# Patient Record
Sex: Female | Born: 1935 | Race: White | Hispanic: No | Smoking: Never smoker
Health system: Southern US, Community
[De-identification: ages and names within clinical notes are randomized; demographics above are authoritative.]

## PROBLEM LIST (undated history)

## (undated) DIAGNOSIS — Z7901 Long term (current) use of anticoagulants: Secondary | ICD-10-CM

## (undated) DIAGNOSIS — I209 Angina pectoris, unspecified: Secondary | ICD-10-CM

## (undated) DIAGNOSIS — I119 Hypertensive heart disease without heart failure: Secondary | ICD-10-CM

## (undated) DIAGNOSIS — I482 Chronic atrial fibrillation, unspecified: Secondary | ICD-10-CM

## (undated) HISTORY — DX: Long term (current) use of anticoagulants: Z79.01

## (undated) HISTORY — PX: LITHOTRIPSY: SUR834

## (undated) HISTORY — DX: Angina pectoris, unspecified: I20.9

## (undated) HISTORY — PX: HAND SURGERY: SHX662

## (undated) HISTORY — DX: Chronic atrial fibrillation, unspecified: I48.20

## (undated) HISTORY — DX: Hypertensive heart disease without heart failure: I11.9

## (undated) HISTORY — PX: CHOLECYSTECTOMY: SHX55

---

## 2009-12-23 ENCOUNTER — Ambulatory Visit (HOSPITAL_COMMUNITY): Admission: RE | Admit: 2009-12-23 | Discharge: 2009-12-23 | Payer: Self-pay | Admitting: Urology

## 2010-06-02 LAB — CBC
Hemoglobin: 13.1 g/dL (ref 12.0–15.0)
MCHC: 34 g/dL (ref 30.0–36.0)
RBC: 4.28 MIL/uL (ref 3.87–5.11)
WBC: 7.5 10*3/uL (ref 4.0–10.5)

## 2010-06-02 LAB — PLATELET FUNCTION ASSAY: Collagen / Epinephrine: 153 seconds (ref 0–184)

## 2010-06-02 LAB — APTT: aPTT: 34 seconds (ref 24–37)

## 2011-03-23 DIAGNOSIS — E7800 Pure hypercholesterolemia, unspecified: Secondary | ICD-10-CM | POA: Diagnosis not present

## 2011-03-23 DIAGNOSIS — I10 Essential (primary) hypertension: Secondary | ICD-10-CM | POA: Diagnosis not present

## 2011-03-23 DIAGNOSIS — E039 Hypothyroidism, unspecified: Secondary | ICD-10-CM | POA: Diagnosis not present

## 2011-03-23 DIAGNOSIS — I4891 Unspecified atrial fibrillation: Secondary | ICD-10-CM | POA: Diagnosis not present

## 2011-05-08 DIAGNOSIS — H2510 Age-related nuclear cataract, unspecified eye: Secondary | ICD-10-CM | POA: Diagnosis not present

## 2011-06-05 DIAGNOSIS — N3090 Cystitis, unspecified without hematuria: Secondary | ICD-10-CM | POA: Diagnosis not present

## 2011-06-05 DIAGNOSIS — E039 Hypothyroidism, unspecified: Secondary | ICD-10-CM | POA: Diagnosis not present

## 2011-06-05 DIAGNOSIS — N200 Calculus of kidney: Secondary | ICD-10-CM | POA: Diagnosis not present

## 2011-06-05 DIAGNOSIS — R3129 Other microscopic hematuria: Secondary | ICD-10-CM | POA: Diagnosis not present

## 2011-06-05 DIAGNOSIS — I10 Essential (primary) hypertension: Secondary | ICD-10-CM | POA: Diagnosis not present

## 2011-06-07 IMAGING — CR DG ABDOMEN 1V
1 series · 1 of 1 positions shown · non-contrast
Comparison: None.

CLINICAL DATA: 82-year-old female preoperative study left renal
calculus.

ABDOMEN - 1 VIEW

[t abdomen supine]
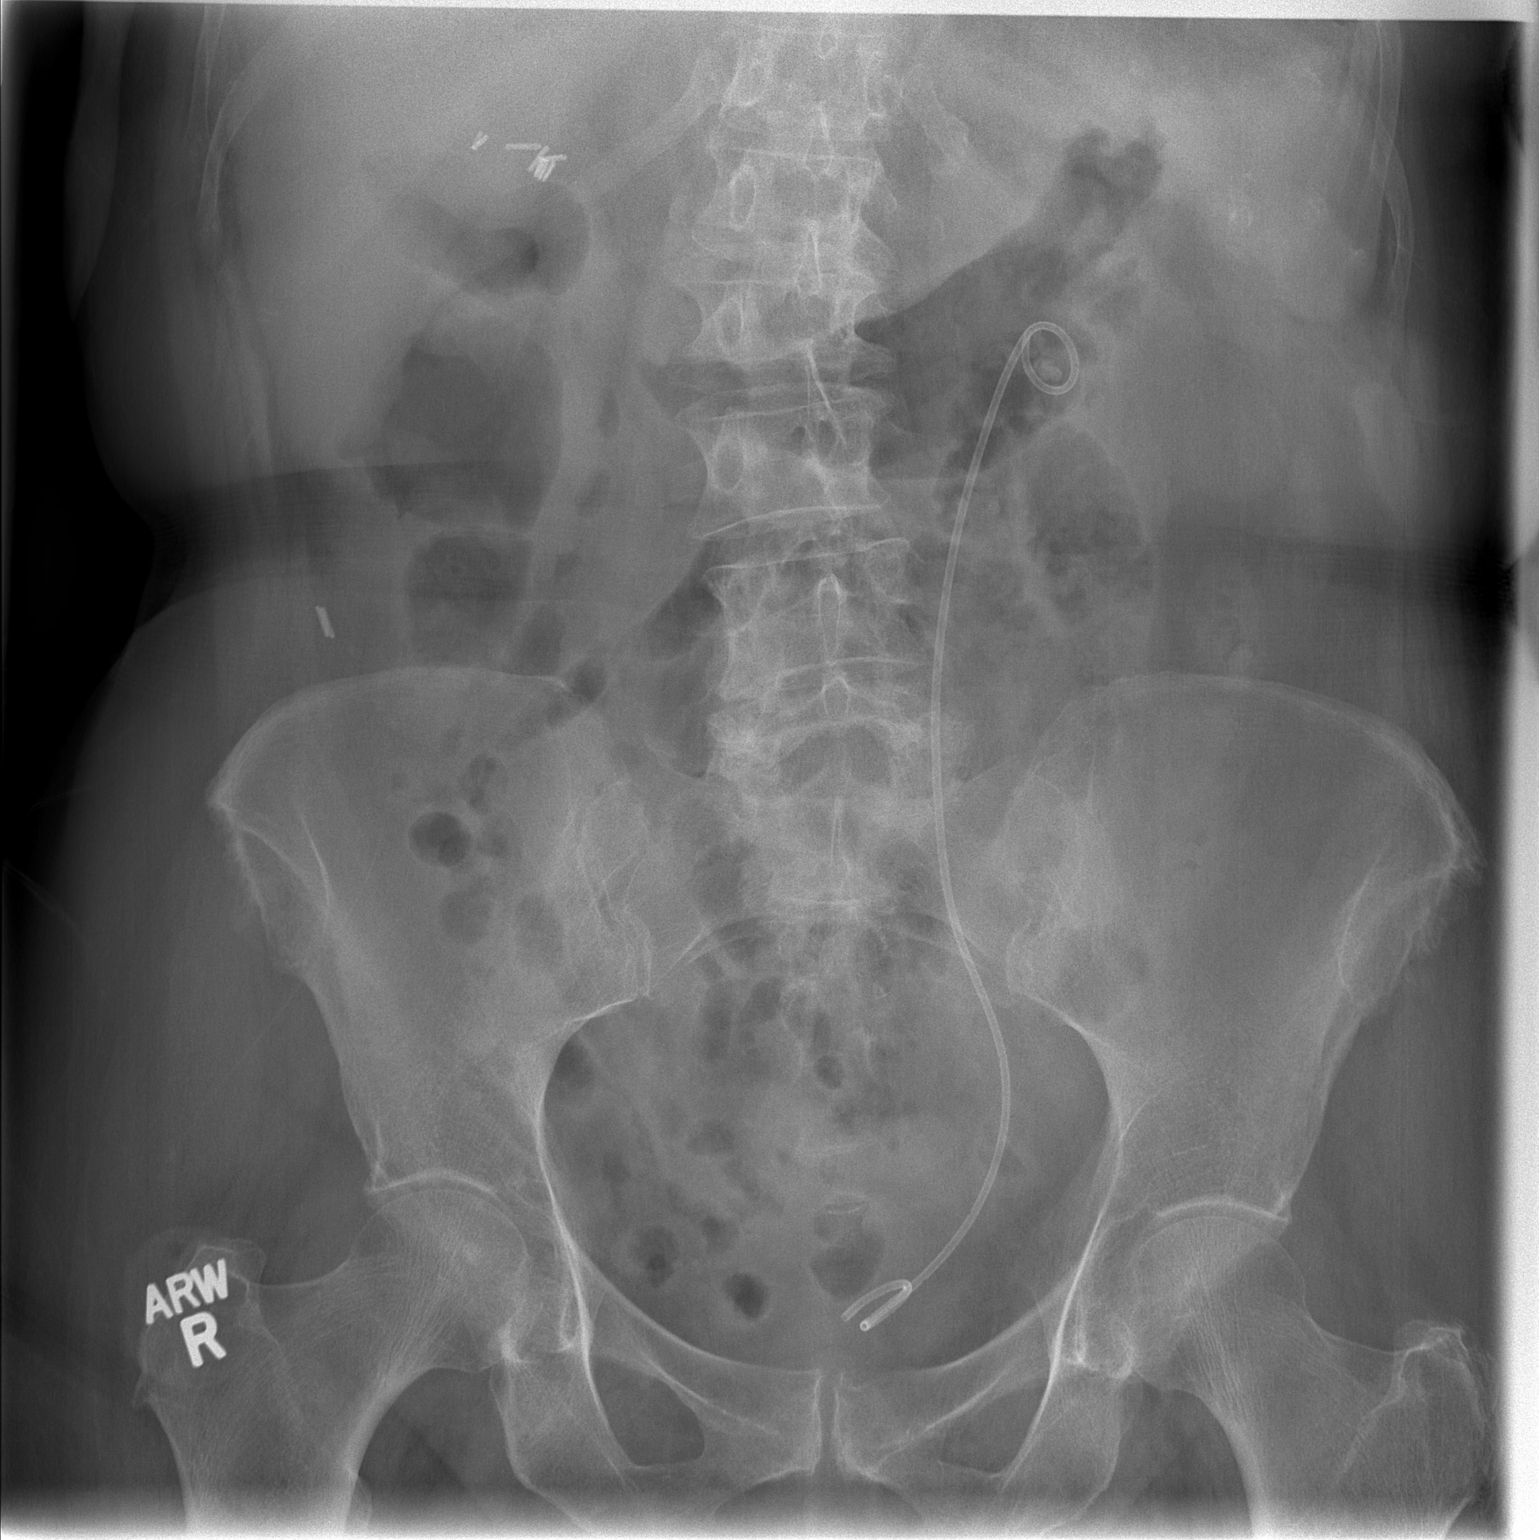

[1 of 1 positions shown; findings below may reference images not displayed]

FINDINGS: Left double-J ureteral stent in place.  The proximal loop
appears to surround a lobulated calcific density measuring 7 x 4
mm.  Alternately, this might reflect enteric contents artifact.
Otherwise no calculus is identified along the course of the stent.
The distal pigtail appears appropriately positioned.  Surgical
clips right upper quadrant and right lower abdomen. Visualized
bowel gas pattern is nonobstructed.  Scoliosis. No acute osseous
abnormality identified.
IMPRESSION: 1.  Query lobulated 7 x 4 mm calculus in the left renal pelvis.
2.  Left double-J ureteral stent in place.  No other urologic
calculus identified.

## 2011-07-18 DIAGNOSIS — H903 Sensorineural hearing loss, bilateral: Secondary | ICD-10-CM | POA: Diagnosis not present

## 2011-08-16 DIAGNOSIS — I10 Essential (primary) hypertension: Secondary | ICD-10-CM | POA: Diagnosis not present

## 2011-08-16 DIAGNOSIS — R079 Chest pain, unspecified: Secondary | ICD-10-CM | POA: Diagnosis not present

## 2011-08-16 DIAGNOSIS — R609 Edema, unspecified: Secondary | ICD-10-CM | POA: Diagnosis not present

## 2011-08-16 DIAGNOSIS — Z7901 Long term (current) use of anticoagulants: Secondary | ICD-10-CM | POA: Diagnosis not present

## 2011-08-16 DIAGNOSIS — I4891 Unspecified atrial fibrillation: Secondary | ICD-10-CM | POA: Diagnosis not present

## 2011-08-22 DIAGNOSIS — I10 Essential (primary) hypertension: Secondary | ICD-10-CM | POA: Diagnosis not present

## 2011-08-22 DIAGNOSIS — I4891 Unspecified atrial fibrillation: Secondary | ICD-10-CM | POA: Diagnosis not present

## 2011-08-22 DIAGNOSIS — E7800 Pure hypercholesterolemia, unspecified: Secondary | ICD-10-CM | POA: Diagnosis not present

## 2011-08-22 DIAGNOSIS — E039 Hypothyroidism, unspecified: Secondary | ICD-10-CM | POA: Diagnosis not present

## 2011-09-06 DIAGNOSIS — I10 Essential (primary) hypertension: Secondary | ICD-10-CM | POA: Diagnosis not present

## 2011-09-06 DIAGNOSIS — R1032 Left lower quadrant pain: Secondary | ICD-10-CM | POA: Diagnosis not present

## 2011-09-06 DIAGNOSIS — I4891 Unspecified atrial fibrillation: Secondary | ICD-10-CM | POA: Diagnosis not present

## 2011-09-12 DIAGNOSIS — R1032 Left lower quadrant pain: Secondary | ICD-10-CM | POA: Diagnosis not present

## 2011-11-06 DIAGNOSIS — H40019 Open angle with borderline findings, low risk, unspecified eye: Secondary | ICD-10-CM | POA: Diagnosis not present

## 2011-11-23 DIAGNOSIS — I10 Essential (primary) hypertension: Secondary | ICD-10-CM | POA: Diagnosis not present

## 2011-11-23 DIAGNOSIS — I4891 Unspecified atrial fibrillation: Secondary | ICD-10-CM | POA: Diagnosis not present

## 2011-11-27 DIAGNOSIS — B351 Tinea unguium: Secondary | ICD-10-CM | POA: Diagnosis not present

## 2011-11-27 DIAGNOSIS — M79609 Pain in unspecified limb: Secondary | ICD-10-CM | POA: Diagnosis not present

## 2012-01-23 DIAGNOSIS — I10 Essential (primary) hypertension: Secondary | ICD-10-CM | POA: Diagnosis not present

## 2012-01-23 DIAGNOSIS — I4891 Unspecified atrial fibrillation: Secondary | ICD-10-CM | POA: Diagnosis not present

## 2012-01-23 DIAGNOSIS — Z23 Encounter for immunization: Secondary | ICD-10-CM | POA: Diagnosis not present

## 2012-04-09 DIAGNOSIS — E039 Hypothyroidism, unspecified: Secondary | ICD-10-CM | POA: Diagnosis not present

## 2012-04-09 DIAGNOSIS — I4891 Unspecified atrial fibrillation: Secondary | ICD-10-CM | POA: Diagnosis not present

## 2012-04-09 DIAGNOSIS — I10 Essential (primary) hypertension: Secondary | ICD-10-CM | POA: Diagnosis not present

## 2012-05-21 DIAGNOSIS — J00 Acute nasopharyngitis [common cold]: Secondary | ICD-10-CM | POA: Diagnosis not present

## 2012-06-10 DIAGNOSIS — H40059 Ocular hypertension, unspecified eye: Secondary | ICD-10-CM | POA: Diagnosis not present

## 2012-06-10 DIAGNOSIS — H2510 Age-related nuclear cataract, unspecified eye: Secondary | ICD-10-CM | POA: Diagnosis not present

## 2012-06-13 DIAGNOSIS — I4891 Unspecified atrial fibrillation: Secondary | ICD-10-CM | POA: Diagnosis not present

## 2012-06-13 DIAGNOSIS — E039 Hypothyroidism, unspecified: Secondary | ICD-10-CM | POA: Diagnosis not present

## 2012-06-13 DIAGNOSIS — I10 Essential (primary) hypertension: Secondary | ICD-10-CM | POA: Diagnosis not present

## 2012-06-24 DIAGNOSIS — M79609 Pain in unspecified limb: Secondary | ICD-10-CM | POA: Diagnosis not present

## 2012-06-24 DIAGNOSIS — B351 Tinea unguium: Secondary | ICD-10-CM | POA: Diagnosis not present

## 2012-07-09 DIAGNOSIS — I10 Essential (primary) hypertension: Secondary | ICD-10-CM | POA: Diagnosis not present

## 2012-07-09 DIAGNOSIS — E039 Hypothyroidism, unspecified: Secondary | ICD-10-CM | POA: Diagnosis not present

## 2012-07-09 DIAGNOSIS — I4891 Unspecified atrial fibrillation: Secondary | ICD-10-CM | POA: Diagnosis not present

## 2012-07-10 DIAGNOSIS — R310 Gross hematuria: Secondary | ICD-10-CM | POA: Diagnosis not present

## 2012-07-15 DIAGNOSIS — R319 Hematuria, unspecified: Secondary | ICD-10-CM | POA: Diagnosis not present

## 2012-07-15 DIAGNOSIS — N200 Calculus of kidney: Secondary | ICD-10-CM | POA: Diagnosis not present

## 2012-07-15 DIAGNOSIS — Q619 Cystic kidney disease, unspecified: Secondary | ICD-10-CM | POA: Diagnosis not present

## 2012-07-17 DIAGNOSIS — I10 Essential (primary) hypertension: Secondary | ICD-10-CM | POA: Diagnosis not present

## 2012-07-17 DIAGNOSIS — Z7901 Long term (current) use of anticoagulants: Secondary | ICD-10-CM | POA: Diagnosis not present

## 2012-07-17 DIAGNOSIS — R319 Hematuria, unspecified: Secondary | ICD-10-CM | POA: Diagnosis not present

## 2012-07-17 DIAGNOSIS — I4891 Unspecified atrial fibrillation: Secondary | ICD-10-CM | POA: Diagnosis not present

## 2012-07-24 DIAGNOSIS — R310 Gross hematuria: Secondary | ICD-10-CM | POA: Diagnosis not present

## 2012-07-24 DIAGNOSIS — Q619 Cystic kidney disease, unspecified: Secondary | ICD-10-CM | POA: Diagnosis not present

## 2012-09-05 DIAGNOSIS — N3090 Cystitis, unspecified without hematuria: Secondary | ICD-10-CM | POA: Diagnosis not present

## 2012-09-05 DIAGNOSIS — N200 Calculus of kidney: Secondary | ICD-10-CM | POA: Diagnosis not present

## 2012-09-17 DIAGNOSIS — N200 Calculus of kidney: Secondary | ICD-10-CM | POA: Diagnosis not present

## 2012-10-01 DIAGNOSIS — N3090 Cystitis, unspecified without hematuria: Secondary | ICD-10-CM | POA: Diagnosis not present

## 2012-10-01 DIAGNOSIS — N200 Calculus of kidney: Secondary | ICD-10-CM | POA: Diagnosis not present

## 2012-10-01 DIAGNOSIS — R310 Gross hematuria: Secondary | ICD-10-CM | POA: Diagnosis not present

## 2012-10-21 DIAGNOSIS — B351 Tinea unguium: Secondary | ICD-10-CM | POA: Diagnosis not present

## 2012-10-21 DIAGNOSIS — M79609 Pain in unspecified limb: Secondary | ICD-10-CM | POA: Diagnosis not present

## 2012-12-03 DIAGNOSIS — R3129 Other microscopic hematuria: Secondary | ICD-10-CM | POA: Diagnosis not present

## 2012-12-03 DIAGNOSIS — Q619 Cystic kidney disease, unspecified: Secondary | ICD-10-CM | POA: Diagnosis not present

## 2012-12-24 DIAGNOSIS — H109 Unspecified conjunctivitis: Secondary | ICD-10-CM | POA: Diagnosis not present

## 2012-12-24 DIAGNOSIS — Z23 Encounter for immunization: Secondary | ICD-10-CM | POA: Diagnosis not present

## 2013-03-17 DIAGNOSIS — R1032 Left lower quadrant pain: Secondary | ICD-10-CM | POA: Diagnosis not present

## 2013-03-17 DIAGNOSIS — I10 Essential (primary) hypertension: Secondary | ICD-10-CM | POA: Diagnosis not present

## 2013-04-07 DIAGNOSIS — R3129 Other microscopic hematuria: Secondary | ICD-10-CM | POA: Diagnosis not present

## 2013-04-07 DIAGNOSIS — N200 Calculus of kidney: Secondary | ICD-10-CM | POA: Diagnosis not present

## 2013-04-07 DIAGNOSIS — Q619 Cystic kidney disease, unspecified: Secondary | ICD-10-CM | POA: Diagnosis not present

## 2013-05-29 DIAGNOSIS — Z7901 Long term (current) use of anticoagulants: Secondary | ICD-10-CM | POA: Diagnosis not present

## 2013-05-29 DIAGNOSIS — Z79899 Other long term (current) drug therapy: Secondary | ICD-10-CM | POA: Diagnosis not present

## 2013-05-29 DIAGNOSIS — Z0000 Encounter for general adult medical examination without abnormal findings: Secondary | ICD-10-CM | POA: Diagnosis not present

## 2013-05-29 DIAGNOSIS — R609 Edema, unspecified: Secondary | ICD-10-CM | POA: Diagnosis not present

## 2013-05-29 DIAGNOSIS — I4891 Unspecified atrial fibrillation: Secondary | ICD-10-CM | POA: Diagnosis not present

## 2013-05-29 DIAGNOSIS — R079 Chest pain, unspecified: Secondary | ICD-10-CM | POA: Diagnosis not present

## 2013-05-29 DIAGNOSIS — J309 Allergic rhinitis, unspecified: Secondary | ICD-10-CM | POA: Diagnosis not present

## 2013-05-29 DIAGNOSIS — I10 Essential (primary) hypertension: Secondary | ICD-10-CM | POA: Diagnosis not present

## 2013-07-17 DIAGNOSIS — H40059 Ocular hypertension, unspecified eye: Secondary | ICD-10-CM | POA: Diagnosis not present

## 2013-07-17 DIAGNOSIS — H2510 Age-related nuclear cataract, unspecified eye: Secondary | ICD-10-CM | POA: Diagnosis not present

## 2013-07-22 ENCOUNTER — Encounter: Payer: Self-pay | Admitting: Podiatrist

## 2013-07-22 ENCOUNTER — Ambulatory Visit (INDEPENDENT_AMBULATORY_CARE_PROVIDER_SITE_OTHER): Payer: Medicare Other | Admitting: Podiatrist

## 2013-07-22 VITALS — BP 105/56 | HR 108 | Resp 18

## 2013-07-22 DIAGNOSIS — B351 Tinea unguium: Secondary | ICD-10-CM | POA: Diagnosis not present

## 2013-07-22 DIAGNOSIS — M79609 Pain in unspecified limb: Secondary | ICD-10-CM | POA: Diagnosis not present

## 2013-07-22 NOTE — Progress Notes (Signed)
   Subjective:    Patient ID: Amber Cline, female    DOB: 10/28/1926, 80 y.o.   MRN: [id number]  HPI I am here to get my toenails trimmed up    Review of Systems     Objective:   Physical Exam  Patient is awake, alert, and oriented x 3.  In no acute distress.  Neurovascular status is intact with palpable pedal pulses at 2/4 DP and PT bilateral and capillary refill time within normal limits. Neurological sensation is also intact bilaterally both epicritically and protectively. Dermatological exam reveals skin color, turger and texture as normal. No open lesions present. Toenails are thick discolored, dystrophic and mycotic esp 1,2 bilateral.   Musculoskeletal examination reveals good muscle, strength, tone and stability. Bunions with crowding of digits present.  Relates she broker her right great toe as a child. Musculature intact with dorsiflexion, plantarflexion, inversion, eversion. subjcetively relates numbness and tingiling    Assessment & Plan:  Painful toenails 1 through 5 bilateral  Plan: Debridement carried out today without complication. She'll be seen back in 3 months or as needed for followup. Of note she was very tender to digits one and 2 bilateral.

## 2013-09-08 DIAGNOSIS — I10 Essential (primary) hypertension: Secondary | ICD-10-CM | POA: Diagnosis not present

## 2013-09-08 DIAGNOSIS — E039 Hypothyroidism, unspecified: Secondary | ICD-10-CM | POA: Diagnosis not present

## 2013-09-08 DIAGNOSIS — J310 Chronic rhinitis: Secondary | ICD-10-CM | POA: Diagnosis not present

## 2013-10-21 DIAGNOSIS — H25049 Posterior subcapsular polar age-related cataract, unspecified eye: Secondary | ICD-10-CM | POA: Diagnosis not present

## 2013-10-21 DIAGNOSIS — H2510 Age-related nuclear cataract, unspecified eye: Secondary | ICD-10-CM | POA: Diagnosis not present

## 2013-10-21 DIAGNOSIS — H18519 Endothelial corneal dystrophy, unspecified eye: Secondary | ICD-10-CM | POA: Diagnosis not present

## 2013-10-21 DIAGNOSIS — H40019 Open angle with borderline findings, low risk, unspecified eye: Secondary | ICD-10-CM | POA: Diagnosis not present

## 2013-10-23 DIAGNOSIS — L723 Sebaceous cyst: Secondary | ICD-10-CM | POA: Diagnosis not present

## 2013-10-23 DIAGNOSIS — R609 Edema, unspecified: Secondary | ICD-10-CM | POA: Diagnosis not present

## 2013-10-30 DIAGNOSIS — H2589 Other age-related cataract: Secondary | ICD-10-CM | POA: Diagnosis not present

## 2013-10-30 DIAGNOSIS — H2510 Age-related nuclear cataract, unspecified eye: Secondary | ICD-10-CM | POA: Diagnosis not present

## 2013-10-30 DIAGNOSIS — H25049 Posterior subcapsular polar age-related cataract, unspecified eye: Secondary | ICD-10-CM | POA: Diagnosis not present

## 2013-11-13 DIAGNOSIS — H2589 Other age-related cataract: Secondary | ICD-10-CM | POA: Diagnosis not present

## 2013-11-13 DIAGNOSIS — H25049 Posterior subcapsular polar age-related cataract, unspecified eye: Secondary | ICD-10-CM | POA: Diagnosis not present

## 2013-11-13 DIAGNOSIS — H2510 Age-related nuclear cataract, unspecified eye: Secondary | ICD-10-CM | POA: Diagnosis not present

## 2013-11-19 DIAGNOSIS — N200 Calculus of kidney: Secondary | ICD-10-CM | POA: Diagnosis not present

## 2013-11-19 DIAGNOSIS — R310 Gross hematuria: Secondary | ICD-10-CM | POA: Diagnosis not present

## 2013-11-19 DIAGNOSIS — R109 Unspecified abdominal pain: Secondary | ICD-10-CM | POA: Diagnosis not present

## 2013-11-25 DIAGNOSIS — N200 Calculus of kidney: Secondary | ICD-10-CM | POA: Diagnosis not present

## 2013-11-25 DIAGNOSIS — Q619 Cystic kidney disease, unspecified: Secondary | ICD-10-CM | POA: Diagnosis not present

## 2013-11-25 DIAGNOSIS — N210 Calculus in bladder: Secondary | ICD-10-CM | POA: Diagnosis not present

## 2013-11-26 DIAGNOSIS — R310 Gross hematuria: Secondary | ICD-10-CM | POA: Diagnosis not present

## 2013-11-26 DIAGNOSIS — N200 Calculus of kidney: Secondary | ICD-10-CM | POA: Diagnosis not present

## 2013-11-27 DIAGNOSIS — N200 Calculus of kidney: Secondary | ICD-10-CM | POA: Diagnosis not present

## 2013-12-23 DIAGNOSIS — Z23 Encounter for immunization: Secondary | ICD-10-CM | POA: Diagnosis not present

## 2013-12-23 DIAGNOSIS — I10 Essential (primary) hypertension: Secondary | ICD-10-CM | POA: Diagnosis not present

## 2013-12-23 DIAGNOSIS — E039 Hypothyroidism, unspecified: Secondary | ICD-10-CM | POA: Diagnosis not present

## 2013-12-29 DIAGNOSIS — N200 Calculus of kidney: Secondary | ICD-10-CM | POA: Diagnosis not present

## 2013-12-29 DIAGNOSIS — R109 Unspecified abdominal pain: Secondary | ICD-10-CM | POA: Diagnosis not present

## 2013-12-30 DIAGNOSIS — I209 Angina pectoris, unspecified: Secondary | ICD-10-CM | POA: Diagnosis not present

## 2013-12-30 DIAGNOSIS — I482 Chronic atrial fibrillation: Secondary | ICD-10-CM | POA: Diagnosis not present

## 2013-12-30 DIAGNOSIS — Z7901 Long term (current) use of anticoagulants: Secondary | ICD-10-CM | POA: Diagnosis not present

## 2013-12-30 DIAGNOSIS — I119 Hypertensive heart disease without heart failure: Secondary | ICD-10-CM | POA: Diagnosis not present

## 2014-03-25 DIAGNOSIS — E559 Vitamin D deficiency, unspecified: Secondary | ICD-10-CM | POA: Diagnosis not present

## 2014-03-25 DIAGNOSIS — I10 Essential (primary) hypertension: Secondary | ICD-10-CM | POA: Diagnosis not present

## 2014-03-25 DIAGNOSIS — E039 Hypothyroidism, unspecified: Secondary | ICD-10-CM | POA: Diagnosis not present

## 2014-03-25 DIAGNOSIS — I4891 Unspecified atrial fibrillation: Secondary | ICD-10-CM | POA: Diagnosis not present

## 2014-04-07 DIAGNOSIS — Z7901 Long term (current) use of anticoagulants: Secondary | ICD-10-CM | POA: Diagnosis not present

## 2014-04-07 DIAGNOSIS — I209 Angina pectoris, unspecified: Secondary | ICD-10-CM | POA: Diagnosis not present

## 2014-04-07 DIAGNOSIS — I119 Hypertensive heart disease without heart failure: Secondary | ICD-10-CM | POA: Diagnosis not present

## 2014-04-07 DIAGNOSIS — I482 Chronic atrial fibrillation: Secondary | ICD-10-CM | POA: Diagnosis not present

## 2014-08-11 DIAGNOSIS — I10 Essential (primary) hypertension: Secondary | ICD-10-CM | POA: Diagnosis not present

## 2014-08-11 DIAGNOSIS — I4891 Unspecified atrial fibrillation: Secondary | ICD-10-CM | POA: Diagnosis not present

## 2014-08-11 DIAGNOSIS — M79602 Pain in left arm: Secondary | ICD-10-CM | POA: Diagnosis not present

## 2014-08-21 DIAGNOSIS — H02054 Trichiasis without entropian left upper eyelid: Secondary | ICD-10-CM | POA: Diagnosis not present

## 2014-08-21 DIAGNOSIS — H04123 Dry eye syndrome of bilateral lacrimal glands: Secondary | ICD-10-CM | POA: Diagnosis not present

## 2014-12-15 DIAGNOSIS — H40053 Ocular hypertension, bilateral: Secondary | ICD-10-CM | POA: Diagnosis not present

## 2014-12-15 DIAGNOSIS — Z961 Presence of intraocular lens: Secondary | ICD-10-CM | POA: Diagnosis not present

## 2014-12-18 DIAGNOSIS — Z23 Encounter for immunization: Secondary | ICD-10-CM | POA: Diagnosis not present

## 2015-03-08 ENCOUNTER — Ambulatory Visit: Payer: Medicare Other | Admitting: Podiatry

## 2015-04-26 DIAGNOSIS — I10 Essential (primary) hypertension: Secondary | ICD-10-CM | POA: Diagnosis not present

## 2015-04-26 DIAGNOSIS — I4891 Unspecified atrial fibrillation: Secondary | ICD-10-CM | POA: Diagnosis not present

## 2015-04-26 DIAGNOSIS — E039 Hypothyroidism, unspecified: Secondary | ICD-10-CM | POA: Diagnosis not present

## 2015-06-06 DIAGNOSIS — Z7901 Long term (current) use of anticoagulants: Secondary | ICD-10-CM | POA: Insufficient documentation

## 2015-06-06 DIAGNOSIS — I119 Hypertensive heart disease without heart failure: Secondary | ICD-10-CM

## 2015-06-06 DIAGNOSIS — I209 Angina pectoris, unspecified: Secondary | ICD-10-CM

## 2015-06-06 DIAGNOSIS — I482 Chronic atrial fibrillation, unspecified: Secondary | ICD-10-CM | POA: Insufficient documentation

## 2015-06-06 HISTORY — DX: Long term (current) use of anticoagulants: Z79.01

## 2015-06-06 HISTORY — DX: Hypertensive heart disease without heart failure: I11.9

## 2015-06-06 HISTORY — DX: Angina pectoris, unspecified: I20.9

## 2015-06-06 HISTORY — DX: Chronic atrial fibrillation, unspecified: I48.20

## 2015-06-07 DIAGNOSIS — I119 Hypertensive heart disease without heart failure: Secondary | ICD-10-CM | POA: Diagnosis not present

## 2015-06-07 DIAGNOSIS — Z7901 Long term (current) use of anticoagulants: Secondary | ICD-10-CM | POA: Diagnosis not present

## 2015-06-07 DIAGNOSIS — I482 Chronic atrial fibrillation: Secondary | ICD-10-CM | POA: Diagnosis not present

## 2015-07-02 DIAGNOSIS — Z7901 Long term (current) use of anticoagulants: Secondary | ICD-10-CM | POA: Diagnosis not present

## 2015-07-02 DIAGNOSIS — I10 Essential (primary) hypertension: Secondary | ICD-10-CM | POA: Diagnosis not present

## 2015-07-09 DIAGNOSIS — Z880 Allergy status to penicillin: Secondary | ICD-10-CM | POA: Diagnosis not present

## 2015-07-09 DIAGNOSIS — R079 Chest pain, unspecified: Secondary | ICD-10-CM | POA: Diagnosis not present

## 2015-07-09 DIAGNOSIS — Z7902 Long term (current) use of antithrombotics/antiplatelets: Secondary | ICD-10-CM | POA: Diagnosis not present

## 2015-07-09 DIAGNOSIS — Z8673 Personal history of transient ischemic attack (TIA), and cerebral infarction without residual deficits: Secondary | ICD-10-CM | POA: Diagnosis not present

## 2015-07-09 DIAGNOSIS — Z79899 Other long term (current) drug therapy: Secondary | ICD-10-CM | POA: Diagnosis not present

## 2015-07-09 DIAGNOSIS — I481 Persistent atrial fibrillation: Secondary | ICD-10-CM | POA: Diagnosis not present

## 2015-07-09 DIAGNOSIS — I200 Unstable angina: Secondary | ICD-10-CM | POA: Diagnosis not present

## 2015-07-09 DIAGNOSIS — I160 Hypertensive urgency: Secondary | ICD-10-CM | POA: Diagnosis not present

## 2015-07-09 DIAGNOSIS — E785 Hyperlipidemia, unspecified: Secondary | ICD-10-CM | POA: Diagnosis present

## 2015-07-09 DIAGNOSIS — I4891 Unspecified atrial fibrillation: Secondary | ICD-10-CM | POA: Diagnosis present

## 2015-07-14 ENCOUNTER — Ambulatory Visit (INDEPENDENT_AMBULATORY_CARE_PROVIDER_SITE_OTHER): Payer: Medicare Other | Admitting: Sports Medicine

## 2015-07-14 ENCOUNTER — Encounter: Payer: Self-pay | Admitting: Sports Medicine

## 2015-07-14 DIAGNOSIS — B351 Tinea unguium: Secondary | ICD-10-CM | POA: Diagnosis not present

## 2015-07-14 DIAGNOSIS — M79673 Pain in unspecified foot: Secondary | ICD-10-CM | POA: Diagnosis not present

## 2015-07-14 DIAGNOSIS — I739 Peripheral vascular disease, unspecified: Secondary | ICD-10-CM

## 2015-07-14 DIAGNOSIS — M21619 Bunion of unspecified foot: Secondary | ICD-10-CM | POA: Diagnosis not present

## 2015-07-14 DIAGNOSIS — Z7901 Long term (current) use of anticoagulants: Secondary | ICD-10-CM

## 2015-07-14 DIAGNOSIS — M2040 Other hammer toe(s) (acquired), unspecified foot: Secondary | ICD-10-CM

## 2015-07-14 NOTE — Progress Notes (Signed)
Patient ID: Amber ChamberJeane E Cline, female   DOB: 02-08-1927, 82 y.o.   MRN: [id number] Subjective: Amber Cline is a 82 y.o. female patient seen today in office with complaint of painful thickened and elongated toenails; unable to trim. Patient denies history of known Diabetes, Neuropathy, or Vascular disease however admits to occasional numbness to the toes and also swelling in the lower extremities, which she uses compression stockings for. Patient has no other pedal complaints at this time.   Patient Active Problem List   Diagnosis Date Noted  . Angina pectoris (HCC) 06/06/2015  . Long term current use of anticoagulant 06/06/2015  . Chronic atrial fibrillation (HCC) 06/06/2015  . HCD (hypertensive cardiovascular disease) 06/06/2015    No current outpatient prescriptions on file prior to visit.   No current facility-administered medications on file prior to visit.    Allergies  Allergen Reactions  . Peanut Oil   . Penicillins     Objective: Physical Exam  General: Well developed, nourished, no acute distress, awake, alert and oriented x 3  Vascular: Dorsalis pedis artery 1/4 bilateral, Posterior tibial artery 1/4 faint bilateral, skin temperature warm to warm proximal to distal bilateral lower extremities, + varicosities,  Trace edema bilateral ankles, Decreased pedal hair present bilateral.  Neurological: Gross sensation present via light touch bilateral.   Dermatological: Skin is warm, dry, and supple bilateral, Nails 1-10 are tender, long, thick, and discolored with mild subungal debris, no webspace macerations present bilateral, no open lesions present bilateral, no callus/corns/hyperkeratotic tissue present bilateral. No signs of infection bilateral.  Musculoskeletal:  Hammertoe and bunion deformities noted bilateral. Muscular strength within normal limits without pain on range of motion. No pain with calf compression bilateral.  Assessment and Plan:  Problem List Items  Addressed This Visit    None    Visit Diagnoses    Dermatophytosis of nail    -  Primary    Relevant Medications    clindamycin (CLEOCIN) 150 MG capsule    metroNIDAZOLE (FLAGYL) 250 MG tablet    Foot pain, unspecified laterality        Bunion        Hammertoe, unspecified laterality        PVD (peripheral vascular disease) (HCC)        Relevant Medications    ELIQUIS 5 MG TABS tablet    candesartan (ATACAND) 32 MG tablet    diltiazem (TIAZAC) 180 MG 24 hr capsule    metoprolol tartrate (LOPRESSOR) 25 MG tablet    Anticoagulant long-term use           -Examined patient.  -Discussed treatment options for painful mycotic nails. -Mechanically debrided and reduced mycotic nails with sterile nail nipper and dremel nail file without incident. -Recommended good supportive shoes daily for foot type -Recommend continue with compression stockings and lower leg elevation to assist with edema control -Patient to return in 4 months/as needed for follow up evaluation or sooner if symptoms worsen.  Asencion Islamitorya Morrison Masser, DPM

## 2015-07-15 DIAGNOSIS — I4891 Unspecified atrial fibrillation: Secondary | ICD-10-CM | POA: Diagnosis not present

## 2015-07-15 DIAGNOSIS — R0602 Shortness of breath: Secondary | ICD-10-CM | POA: Diagnosis not present

## 2015-07-23 DIAGNOSIS — I4891 Unspecified atrial fibrillation: Secondary | ICD-10-CM | POA: Diagnosis not present

## 2015-07-28 DIAGNOSIS — E039 Hypothyroidism, unspecified: Secondary | ICD-10-CM | POA: Diagnosis not present

## 2015-07-28 DIAGNOSIS — I4891 Unspecified atrial fibrillation: Secondary | ICD-10-CM | POA: Diagnosis not present

## 2015-07-28 DIAGNOSIS — I10 Essential (primary) hypertension: Secondary | ICD-10-CM | POA: Diagnosis not present

## 2015-09-06 DIAGNOSIS — H5712 Ocular pain, left eye: Secondary | ICD-10-CM | POA: Diagnosis not present

## 2015-09-29 DIAGNOSIS — I10 Essential (primary) hypertension: Secondary | ICD-10-CM | POA: Diagnosis not present

## 2015-09-29 DIAGNOSIS — E039 Hypothyroidism, unspecified: Secondary | ICD-10-CM | POA: Diagnosis not present

## 2015-09-29 DIAGNOSIS — I4891 Unspecified atrial fibrillation: Secondary | ICD-10-CM | POA: Diagnosis not present

## 2015-11-10 ENCOUNTER — Ambulatory Visit: Payer: Medicare Other | Admitting: Sports Medicine

## 2015-11-25 ENCOUNTER — Ambulatory Visit: Payer: Medicare Other | Admitting: Sports Medicine

## 2015-12-08 ENCOUNTER — Ambulatory Visit: Payer: Medicare Other | Admitting: Sports Medicine

## 2015-12-23 ENCOUNTER — Ambulatory Visit: Payer: Medicare Other | Admitting: Sports Medicine

## 2016-01-04 DIAGNOSIS — I119 Hypertensive heart disease without heart failure: Secondary | ICD-10-CM | POA: Diagnosis not present

## 2016-01-04 DIAGNOSIS — I482 Chronic atrial fibrillation: Secondary | ICD-10-CM | POA: Diagnosis not present

## 2016-01-04 DIAGNOSIS — Z7901 Long term (current) use of anticoagulants: Secondary | ICD-10-CM | POA: Diagnosis not present

## 2016-01-05 DIAGNOSIS — Z23 Encounter for immunization: Secondary | ICD-10-CM | POA: Diagnosis not present

## 2016-01-05 DIAGNOSIS — I4891 Unspecified atrial fibrillation: Secondary | ICD-10-CM | POA: Diagnosis not present

## 2016-01-05 DIAGNOSIS — E785 Hyperlipidemia, unspecified: Secondary | ICD-10-CM | POA: Diagnosis not present

## 2016-01-05 DIAGNOSIS — E039 Hypothyroidism, unspecified: Secondary | ICD-10-CM | POA: Diagnosis not present

## 2016-01-05 DIAGNOSIS — I10 Essential (primary) hypertension: Secondary | ICD-10-CM | POA: Diagnosis not present

## 2016-04-11 DIAGNOSIS — H40053 Ocular hypertension, bilateral: Secondary | ICD-10-CM | POA: Diagnosis not present

## 2016-04-11 DIAGNOSIS — Z0100 Encounter for examination of eyes and vision without abnormal findings: Secondary | ICD-10-CM | POA: Diagnosis not present

## 2016-04-26 DIAGNOSIS — J101 Influenza due to other identified influenza virus with other respiratory manifestations: Secondary | ICD-10-CM | POA: Diagnosis not present

## 2016-07-06 DIAGNOSIS — R609 Edema, unspecified: Secondary | ICD-10-CM | POA: Diagnosis not present

## 2016-08-02 DIAGNOSIS — I4891 Unspecified atrial fibrillation: Secondary | ICD-10-CM | POA: Diagnosis not present

## 2016-08-02 DIAGNOSIS — I10 Essential (primary) hypertension: Secondary | ICD-10-CM | POA: Diagnosis not present

## 2016-08-02 DIAGNOSIS — E039 Hypothyroidism, unspecified: Secondary | ICD-10-CM | POA: Diagnosis not present

## 2016-08-02 DIAGNOSIS — Z7901 Long term (current) use of anticoagulants: Secondary | ICD-10-CM | POA: Diagnosis not present

## 2016-08-02 DIAGNOSIS — M25561 Pain in right knee: Secondary | ICD-10-CM | POA: Diagnosis not present

## 2016-08-30 DIAGNOSIS — M25561 Pain in right knee: Secondary | ICD-10-CM | POA: Diagnosis not present

## 2016-09-15 ENCOUNTER — Ambulatory Visit (INDEPENDENT_AMBULATORY_CARE_PROVIDER_SITE_OTHER): Payer: Medicare Other | Admitting: Sports Medicine

## 2016-09-15 DIAGNOSIS — B351 Tinea unguium: Secondary | ICD-10-CM

## 2016-09-15 DIAGNOSIS — M79676 Pain in unspecified toe(s): Secondary | ICD-10-CM | POA: Diagnosis not present

## 2016-09-15 DIAGNOSIS — Z7901 Long term (current) use of anticoagulants: Secondary | ICD-10-CM

## 2016-09-15 DIAGNOSIS — I739 Peripheral vascular disease, unspecified: Secondary | ICD-10-CM

## 2016-09-15 NOTE — Progress Notes (Signed)
Patient ID: Amber Cline, female   DOB: [birth year], 83 y.o.   MRN: [id number] Subjective: Amber Cline is a 83 y.o. female patient seen today in office with complaint of painful thickened and elongated toenails; unable to trim. Patient denies any changes with medical history since last visit. States that she has a hard time getting here on a regular schedule because of her sons work. Patient has no other pedal complaints at this time.   Patient Active Problem List   Diagnosis Date Noted  . Angina pectoris (HCC) 06/06/2015  . Long term current use of anticoagulant 06/06/2015  . Chronic atrial fibrillation (HCC) 06/06/2015  . HCD (hypertensive cardiovascular disease) 06/06/2015    Current Outpatient Prescriptions on File Prior to Visit  Medication Sig Dispense Refill  . BOOSTRIX 5-2.5-18.5 injection TO BE ADMINISTERED BY PHARMACIST FOR IMMUNIZATION  0  . candesartan (ATACAND) 32 MG tablet Take 32 mg by mouth.    . clindamycin (CLEOCIN) 150 MG capsule TAKE ALL 4 CAPSULES 1 HOUR BEFORE ANY DENTAL APPOINTMENT  12  . diltiazem (TIAZAC) 180 MG 24 hr capsule     . ELIQUIS 5 MG TABS tablet TAKE 1 TABLET (5 MG TOTAL) BY MOUTH TWO (2) TIMES A DAY. AFTER MISSING 4 DOSES OF PRADAXA  11  . levothyroxine (SYNTHROID) 137 MCG tablet Take by mouth.    . metoprolol tartrate (LOPRESSOR) 25 MG tablet TAKE 1/2 TABLET (12.5 MG) BY MOUTH TWICE DAILY  0  . metroNIDAZOLE (FLAGYL) 250 MG tablet Take 250 mg by mouth 2 (two) times daily.  0  . SYNTHROID 125 MCG tablet Take 125 mcg by mouth daily.  3  . SYNTHROID 137 MCG tablet Take 137 mcg by mouth daily.  2  . Vitamin D, Ergocalciferol, (DRISDOL) 50000 units CAPS capsule Take by mouth.     No current facility-administered medications on file prior to visit.     Allergies  Allergen Reactions  . Peanut Oil   . Penicillins     Objective: Physical Exam  General: Well developed, nourished, no acute distress, awake, alert and oriented x 3  Vascular:  Dorsalis pedis artery 1/4 bilateral, Posterior tibial artery 1/4 faint bilateral, skin temperature warm to warm proximal to distal bilateral lower extremities, + varicosities,  Trace edema bilateral ankles, Decreased pedal hair present bilateral.  Neurological: Gross sensation present via light touch bilateral.   Dermatological: Skin is warm, dry, and supple bilateral, Nails 1-10 are tender, long, thick, and discolored with moderate subungal debris, no webspace macerations present bilateral, no open lesions present bilateral, no callus/corns/hyperkeratotic tissue present bilateral. No signs of infection bilateral.  Musculoskeletal:  Hammertoe and bunion deformities noted bilateral. Muscular strength within normal limits without pain on range of motion. No pain with calf compression bilateral.  Assessment and Plan:  Problem List Items Addressed This Visit    None    Visit Diagnoses    Dermatophytosis of nail    -  Primary   PVD (peripheral vascular disease) (HCC)       Anticoagulant long-term use          -Examined patient.  -Discussed treatment options for painful mycotic nails. -Mechanically debrided and reduced mycotic nails with sterile nail nipper and dremel nail file without incident. -Recommend patient left second toenail to apply antibiotic cream since nail was deemed into skin to prevent against infection -Recommended good supportive shoes daily for foot type -Recommend continue with compression stockings and lower leg elevation to assist with edema control -Patient  to return in 3 months/as needed for follow up evaluation or sooner if symptoms worsen.  [name], DPM

## 2016-09-25 DIAGNOSIS — I480 Paroxysmal atrial fibrillation: Secondary | ICD-10-CM | POA: Diagnosis not present

## 2016-09-25 DIAGNOSIS — Z79899 Other long term (current) drug therapy: Secondary | ICD-10-CM | POA: Diagnosis not present

## 2016-09-25 DIAGNOSIS — E039 Hypothyroidism, unspecified: Secondary | ICD-10-CM | POA: Diagnosis not present

## 2016-09-25 DIAGNOSIS — E785 Hyperlipidemia, unspecified: Secondary | ICD-10-CM | POA: Diagnosis not present

## 2016-09-25 DIAGNOSIS — R079 Chest pain, unspecified: Secondary | ICD-10-CM | POA: Diagnosis not present

## 2016-09-25 DIAGNOSIS — Z23 Encounter for immunization: Secondary | ICD-10-CM | POA: Diagnosis not present

## 2016-09-25 DIAGNOSIS — Z8673 Personal history of transient ischemic attack (TIA), and cerebral infarction without residual deficits: Secondary | ICD-10-CM | POA: Diagnosis not present

## 2016-09-25 DIAGNOSIS — I10 Essential (primary) hypertension: Secondary | ICD-10-CM | POA: Diagnosis not present

## 2016-09-25 DIAGNOSIS — Z7901 Long term (current) use of anticoagulants: Secondary | ICD-10-CM | POA: Diagnosis not present

## 2016-09-25 DIAGNOSIS — I4891 Unspecified atrial fibrillation: Secondary | ICD-10-CM | POA: Diagnosis not present

## 2016-09-26 DIAGNOSIS — R079 Chest pain, unspecified: Secondary | ICD-10-CM | POA: Diagnosis not present

## 2016-09-26 DIAGNOSIS — E039 Hypothyroidism, unspecified: Secondary | ICD-10-CM | POA: Diagnosis not present

## 2016-09-26 DIAGNOSIS — I4891 Unspecified atrial fibrillation: Secondary | ICD-10-CM | POA: Diagnosis not present

## 2016-09-26 DIAGNOSIS — E875 Hyperkalemia: Secondary | ICD-10-CM | POA: Diagnosis not present

## 2016-09-26 DIAGNOSIS — I10 Essential (primary) hypertension: Secondary | ICD-10-CM | POA: Diagnosis not present

## 2016-09-27 DIAGNOSIS — I10 Essential (primary) hypertension: Secondary | ICD-10-CM | POA: Diagnosis not present

## 2016-09-27 DIAGNOSIS — E039 Hypothyroidism, unspecified: Secondary | ICD-10-CM | POA: Diagnosis not present

## 2016-09-27 DIAGNOSIS — R079 Chest pain, unspecified: Secondary | ICD-10-CM | POA: Diagnosis not present

## 2016-09-27 DIAGNOSIS — I4891 Unspecified atrial fibrillation: Secondary | ICD-10-CM | POA: Diagnosis not present

## 2016-09-27 DIAGNOSIS — E875 Hyperkalemia: Secondary | ICD-10-CM | POA: Diagnosis not present

## 2016-10-04 DIAGNOSIS — I10 Essential (primary) hypertension: Secondary | ICD-10-CM | POA: Diagnosis not present

## 2016-10-04 DIAGNOSIS — I4891 Unspecified atrial fibrillation: Secondary | ICD-10-CM | POA: Diagnosis not present

## 2016-10-04 DIAGNOSIS — R079 Chest pain, unspecified: Secondary | ICD-10-CM | POA: Diagnosis not present

## 2016-10-04 DIAGNOSIS — M25561 Pain in right knee: Secondary | ICD-10-CM | POA: Diagnosis not present

## 2016-10-23 ENCOUNTER — Other Ambulatory Visit: Payer: Self-pay

## 2016-10-23 MED ORDER — DILTIAZEM HCL ER BEADS 240 MG PO CP24: 240.0000 mg | ORAL_CAPSULE | Freq: Every day | ORAL | 2 refills | Status: DC

## 2016-10-23 NOTE — Telephone Encounter (Signed)
Verified that patient will be following Dr. Dulce SellarMunley. Patient verified that diltiazem dose was increased to 240 mg daily after recent hospitalization. Patient states that son will be calling to make appointment.

## 2016-11-01 DIAGNOSIS — Z961 Presence of intraocular lens: Secondary | ICD-10-CM | POA: Diagnosis not present

## 2016-11-01 DIAGNOSIS — H26493 Other secondary cataract, bilateral: Secondary | ICD-10-CM | POA: Diagnosis not present

## 2016-11-02 ENCOUNTER — Ambulatory Visit: Payer: Self-pay | Admitting: Cardiology

## 2016-11-02 DIAGNOSIS — M6281 Muscle weakness (generalized): Secondary | ICD-10-CM | POA: Diagnosis not present

## 2016-11-02 DIAGNOSIS — M25561 Pain in right knee: Secondary | ICD-10-CM | POA: Diagnosis not present

## 2016-11-02 DIAGNOSIS — R262 Difficulty in walking, not elsewhere classified: Secondary | ICD-10-CM | POA: Diagnosis not present

## 2016-11-05 NOTE — Progress Notes (Deleted)
  Cardiology Office Note:    Date:  11/05/2016   ID:  Amber Cline, DOB 23-Mar-1926, MRN [id number]  PCP:  Eloisa Northern, MD  Cardiologist:  Norman Herrlich, MD    Referring MD: Eloisa Northern, MD    ASSESSMENT:    No diagnosis found. PLAN:    In order of problems listed above:  1. ***   Next appointment: ***   Medication Adjustments/Labs and Tests Ordered: Current medicines are reviewed at length with the patient today.  Concerns regarding medicines are outlined above.  No orders of the defined types were placed in this encounter.  No orders of the defined types were placed in this encounter.   No chief complaint on file.   History of Present Illness:    Amber Cline is a 83 y.o. female with a hx of Chronic Atrial Fibrillation with anticoagulation, HTN , chronic venous insufficiency with edema and angina with normal coronary arteriography last seen in April 2018. Compliance with diet, lifestyle and medications: *** Past Medical History:  Diagnosis Date  . Angina pectoris (HCC) 06/06/2015   Overview:  With normal coronary arteriography 2006  Overview:  With normal coronary arteriography 2006  . Chronic atrial fibrillation (HCC) 06/06/2015   Overview:  CHADS2 vasc score= 6  Overview:  CHADS2 vasc score= 6  . HCD (hypertensive cardiovascular disease) 06/06/2015  . Long term current use of anticoagulant 06/06/2015    Past Surgical History:  Procedure Laterality Date  . CHOLECYSTECTOMY    . HAND SURGERY    . LITHOTRIPSY      Current Medications: No outpatient prescriptions have been marked as taking for the 11/06/16 encounter (Appointment) with Baldo Daub, MD.     Allergies:   Peanut oil and Penicillins   Social History   Social History  . Marital status: Widowed    Spouse name: N/A  . Number of children: N/A  . Years of education: N/A   Social History Main Topics  . Smoking status: Never Smoker  . Smokeless tobacco: Never Used  . Alcohol use No  . Drug  use: No  . Sexual activity: Not on file   Other Topics Concern  . Not on file   Social History Narrative  . No narrative on file     Family History: The patient's ***family history includes Heart disease in her father. ROS:   Please see the history of present illness.    All other systems reviewed and are negative.  EKGs/Labs/Other Studies Reviewed:    The following studies were reviewed today:  EKG:  EKG ordered today.  The ekg ordered today demonstrates ***  Recent Labs: No results found for requested labs within last 8760 hours.  Recent Lipid Panel No results found for: CHOL, TRIG, HDL, CHOLHDL, VLDL, LDLCALC, LDLDIRECT  Physical Exam:    VS:  There were no vitals taken for this visit.    Wt Readings from Last 3 Encounters:  No data found for Wt     GEN: *** Well nourished, well developed in no acute distress HEENT: Normal NECK: No JVD; No carotid bruits LYMPHATICS: No lymphadenopathy CARDIAC: ***RRR, no murmurs, rubs, gallops RESPIRATORY:  Clear to auscultation without rales, wheezing or rhonchi  ABDOMEN: Soft, non-tender, non-distended MUSCULOSKELETAL:  No edema; No deformity  SKIN: Warm and dry NEUROLOGIC:  Alert and oriented x 3 PSYCHIATRIC:  Normal affect    Signed, Norman Herrlich, MD  11/05/2016 8:04 PM    [facility]

## 2016-11-06 ENCOUNTER — Ambulatory Visit: Payer: Medicare Other | Admitting: Cardiology

## 2016-11-08 DIAGNOSIS — I10 Essential (primary) hypertension: Secondary | ICD-10-CM | POA: Diagnosis not present

## 2016-11-08 DIAGNOSIS — Z7901 Long term (current) use of anticoagulants: Secondary | ICD-10-CM | POA: Diagnosis not present

## 2016-11-08 DIAGNOSIS — M25561 Pain in right knee: Secondary | ICD-10-CM | POA: Diagnosis not present

## 2016-11-08 DIAGNOSIS — I4891 Unspecified atrial fibrillation: Secondary | ICD-10-CM | POA: Diagnosis not present

## 2016-11-08 DIAGNOSIS — E039 Hypothyroidism, unspecified: Secondary | ICD-10-CM | POA: Diagnosis not present

## 2016-11-10 DIAGNOSIS — M25561 Pain in right knee: Secondary | ICD-10-CM | POA: Diagnosis not present

## 2016-11-10 DIAGNOSIS — R262 Difficulty in walking, not elsewhere classified: Secondary | ICD-10-CM | POA: Diagnosis not present

## 2016-11-10 DIAGNOSIS — M6281 Muscle weakness (generalized): Secondary | ICD-10-CM | POA: Diagnosis not present

## 2016-11-13 NOTE — Progress Notes (Signed)
Cardiology Office Note:    Date:  11/16/2016   ID:  Amber Cline, DOB [birth year], MRN [id number]  PCP:  Eloisa Northern, MD  Cardiologist:  Norman Herrlich, MD    Referring MD: Eloisa Northern, MD    ASSESSMENT:    1. Chronic atrial fibrillation (HCC)   2. Hypertensive heart disease, unspecified whether heart failure present    PLAN:    In order of problems listed above:  1. Rate appears to be well controlled on current low-dose beta and calcium channel blocker continue anticoagulation and use a pill box for medication compliance.I asked her to utilize a Holter monitor for 48 hours to assess her global heart rate whether she needs additional suppressive treatment. 2. Stable blood pressure target continue her ARB   Next appointment: 6 months   Medication Adjustments/Labs and Tests Ordered: Current medicines are reviewed at length with the patient today.  Concerns regarding medicines are outlined above.  Orders Placed This Encounter  Procedures  . Holter monitor - 48 hour   No orders of the defined types were placed in this encounter.   Chief Complaint  Patient presents with  . Follow-up    Routine flup appt     History of Present Illness:    Amber Cline is a 83 y.o. female with a hx of Chronic Atrial Fibrillation with anticoagulation, HTN and edema last seen in April 2018. She was admitted to [facility] in July with rapid AF. No significant change was made in medicationand she was discharged home. Her troponin was normal and heart rate was subsequently controlled. I suspect that she missed doses of her medication and she is now using a pill box and her son is helping to supervise. He'll start to do a blood pressure daily to record the heart rate and blood pressure. Since discharge no palpitation chest pain shortness of breath syncope or TIA Compliance with diet, lifestyle and medications: yes Past Medical History:  Diagnosis Date  . Angina pectoris (HCC) 06/06/2015   Overview:   With normal coronary arteriography 2006  Overview:  With normal coronary arteriography 2006  . Chronic atrial fibrillation (HCC) 06/06/2015   Overview:  CHADS2 vasc score= 6  Overview:  CHADS2 vasc score= 6  . HCD (hypertensive cardiovascular disease) 06/06/2015  . Long term current use of anticoagulant 06/06/2015    Past Surgical History:  Procedure Laterality Date  . CHOLECYSTECTOMY    . HAND SURGERY    . LITHOTRIPSY      Current Medications: Current Meds  Medication Sig  . apixaban (ELIQUIS) 2.5 MG TABS tablet Take 2.5 mg by mouth 2 (two) times daily.  . candesartan (ATACAND) 32 MG tablet Take 32 mg by mouth daily.   . clindamycin (CLEOCIN) 150 MG capsule TAKE ALL 4 CAPSULES 1 HOUR BEFORE ANY DENTAL APPOINTMENT  . diltiazem (TIAZAC) 240 MG 24 hr capsule Take 1 capsule (240 mg total) by mouth daily.  Marland Kitchen levothyroxine (SYNTHROID) 125 MCG tablet Take 125 mcg by mouth daily.  . metoprolol tartrate (LOPRESSOR) 25 MG tablet TAKE 1/2 TABLET (12.5 MG) BY MOUTH TWICE DAILY  . nitroGLYCERIN (NITROSTAT) 0.4 MG SL tablet Place 0.4 mg under the tongue every 5 (five) minutes x 3 doses as needed for chest pain.  . Vitamin D, Ergocalciferol, (DRISDOL) 50000 units CAPS capsule Take 50,000 Units by mouth every 7 (seven) days.      Allergies:   Peanut oil; Penicillins; and Cortisone   Social History   Social History  .  Marital status: Widowed    Spouse name: N/A  . Number of children: N/A  . Years of education: N/A   Social History Main Topics  . Smoking status: Never Smoker  . Smokeless tobacco: Never Used  . Alcohol use No  . Drug use: No  . Sexual activity: Not Asked   Other Topics Concern  . None   Social History Narrative  . None     Family History: The patient's family history includes Deafness in her mother; Heart disease in her brother, father, and paternal grandfather. ROS:   Please see the history of present illness.    All other systems reviewed and are  negative.  EKGs/Labs/Other Studies Reviewed:    The following studies were reviewed today:  [facility] records including emergency room discharge labs and EKGs reviewed prior to the visit  Physical Exam:    VS:  BP 134/70 (BP Location: Right Arm, Patient Position: Sitting)   Pulse 68   Ht 5\' 6"  (1.676 m)   Wt 132 lb 6.4 oz (60.1 kg)   SpO2 98%   BMI 21.37 kg/m     Wt Readings from Last 3 Encounters:  11/16/16 132 lb 6.4 oz (60.1 kg)     GEN:  Well nourished, well developed in no acute distress HEENT: Normal NECK: No JVD; No carotid bruits LYMPHATICS: No lymphadenopathy CARDIAC: irregular irregular irregular first heart sound is variable, no murmurs, rubs, gallops RESPIRATORY:  Clear to auscultation without rales, wheezing or rhonchi  ABDOMEN: Soft, non-tender, non-distended MUSCULOSKELETAL:  No edema; No deformity  SKIN: Warm and dry NEUROLOGIC:  Alert and oriented x 3 PSYCHIATRIC:  Normal affect    Signed, Norman Herrlich, MD  11/16/2016 4:19 PM    [facility]

## 2016-11-15 DIAGNOSIS — R262 Difficulty in walking, not elsewhere classified: Secondary | ICD-10-CM | POA: Diagnosis not present

## 2016-11-15 DIAGNOSIS — M25561 Pain in right knee: Secondary | ICD-10-CM | POA: Diagnosis not present

## 2016-11-15 DIAGNOSIS — M6281 Muscle weakness (generalized): Secondary | ICD-10-CM | POA: Diagnosis not present

## 2016-11-16 ENCOUNTER — Ambulatory Visit (INDEPENDENT_AMBULATORY_CARE_PROVIDER_SITE_OTHER): Payer: Medicare Other | Admitting: Cardiology

## 2016-11-16 ENCOUNTER — Encounter: Payer: Self-pay | Admitting: Cardiology

## 2016-11-16 VITALS — BP 134/70 | HR 68 | Ht 66.0 in | Wt 132.4 lb

## 2016-11-16 DIAGNOSIS — I482 Chronic atrial fibrillation, unspecified: Secondary | ICD-10-CM

## 2016-11-16 DIAGNOSIS — I119 Hypertensive heart disease without heart failure: Secondary | ICD-10-CM | POA: Diagnosis not present

## 2016-11-16 NOTE — Patient Instructions (Addendum)
Medication Instructions:  Your physician recommends that you continue on your current medications as directed. Please refer to the Current Medication list given to you today.  Labwork: None  Testing/Procedures: Your physician has recommended that you wear a holter monitor. Holter monitors are medical devices that record the heart's electrical activity. Doctors most often use these monitors to diagnose arrhythmias. Arrhythmias are problems with the speed or rhythm of the heartbeat. The monitor is a small, portable device. You can wear one while you do your normal daily activities. This is usually used to diagnose what is causing palpitations/syncope (passing out).   Follow-Up: Your physician recommends that you schedule a follow-up appointment in: 3 months.   Any Other Special Instructions Will Be Listed Below (If Applicable).     If you need a refill on your cardiac medications before your next appointment, please call your pharmacy.    KardiaMobile [email or web address]        FDA-cleared, clinical grade mobile EKG monitor: Lourena SimmondsKardia is the most clinically-validated mobile EKG used by the world's leading cardiac care medical professionals With Basic service, know instantly if your heart rhythm is normal or if atrial fibrillation is detected, and email the last single EKG recording to yourself or your doctor Premium service, available for purchase through the Kardia app for $9.99 per month or $99 per year, includes unlimited history and storage of your EKG recordings, a monthly EKG summary report to share with your doctor, along with the ability to track your blood pressure, activity and weight Includes one KardiaMobile phone clip FREE SHIPPING: Standard delivery 1-3 business days. Orders placed by 11:00am PST will ship that afternoon. Otherwise, will ship next business day. All orders ship via [employer] Priority Mail from [place]

## 2016-11-22 DIAGNOSIS — M6281 Muscle weakness (generalized): Secondary | ICD-10-CM | POA: Diagnosis not present

## 2016-11-22 DIAGNOSIS — M25561 Pain in right knee: Secondary | ICD-10-CM | POA: Diagnosis not present

## 2016-11-22 DIAGNOSIS — R262 Difficulty in walking, not elsewhere classified: Secondary | ICD-10-CM | POA: Diagnosis not present

## 2016-11-23 DIAGNOSIS — I4891 Unspecified atrial fibrillation: Secondary | ICD-10-CM | POA: Diagnosis not present

## 2016-11-23 DIAGNOSIS — I482 Chronic atrial fibrillation, unspecified: Secondary | ICD-10-CM

## 2016-11-24 DIAGNOSIS — R262 Difficulty in walking, not elsewhere classified: Secondary | ICD-10-CM | POA: Diagnosis not present

## 2016-11-24 DIAGNOSIS — M25561 Pain in right knee: Secondary | ICD-10-CM | POA: Diagnosis not present

## 2016-11-24 DIAGNOSIS — M6281 Muscle weakness (generalized): Secondary | ICD-10-CM | POA: Diagnosis not present

## 2016-11-27 DIAGNOSIS — M6281 Muscle weakness (generalized): Secondary | ICD-10-CM | POA: Diagnosis not present

## 2016-11-27 DIAGNOSIS — R262 Difficulty in walking, not elsewhere classified: Secondary | ICD-10-CM | POA: Diagnosis not present

## 2016-11-27 DIAGNOSIS — M25561 Pain in right knee: Secondary | ICD-10-CM | POA: Diagnosis not present

## 2016-12-06 DIAGNOSIS — M25561 Pain in right knee: Secondary | ICD-10-CM | POA: Diagnosis not present

## 2016-12-06 DIAGNOSIS — M6281 Muscle weakness (generalized): Secondary | ICD-10-CM | POA: Diagnosis not present

## 2016-12-06 DIAGNOSIS — R262 Difficulty in walking, not elsewhere classified: Secondary | ICD-10-CM | POA: Diagnosis not present

## 2016-12-11 DIAGNOSIS — M6281 Muscle weakness (generalized): Secondary | ICD-10-CM | POA: Diagnosis not present

## 2016-12-11 DIAGNOSIS — M25561 Pain in right knee: Secondary | ICD-10-CM | POA: Diagnosis not present

## 2016-12-11 DIAGNOSIS — R262 Difficulty in walking, not elsewhere classified: Secondary | ICD-10-CM | POA: Diagnosis not present

## 2016-12-12 ENCOUNTER — Other Ambulatory Visit: Payer: Self-pay

## 2016-12-12 MED ORDER — DILTIAZEM HCL ER BEADS 240 MG PO CP24: 240.0000 mg | ORAL_CAPSULE | Freq: Every day | ORAL | 2 refills | Status: DC

## 2016-12-13 DIAGNOSIS — M25561 Pain in right knee: Secondary | ICD-10-CM | POA: Diagnosis not present

## 2016-12-13 DIAGNOSIS — M6281 Muscle weakness (generalized): Secondary | ICD-10-CM | POA: Diagnosis not present

## 2016-12-13 DIAGNOSIS — R262 Difficulty in walking, not elsewhere classified: Secondary | ICD-10-CM | POA: Diagnosis not present

## 2016-12-18 DIAGNOSIS — R262 Difficulty in walking, not elsewhere classified: Secondary | ICD-10-CM | POA: Diagnosis not present

## 2016-12-18 DIAGNOSIS — M6281 Muscle weakness (generalized): Secondary | ICD-10-CM | POA: Diagnosis not present

## 2016-12-18 DIAGNOSIS — M25561 Pain in right knee: Secondary | ICD-10-CM | POA: Diagnosis not present

## 2016-12-20 ENCOUNTER — Ambulatory Visit: Payer: Medicare Other | Admitting: Sports Medicine

## 2017-01-04 ENCOUNTER — Ambulatory Visit (INDEPENDENT_AMBULATORY_CARE_PROVIDER_SITE_OTHER): Payer: Medicare Other | Admitting: Sports Medicine

## 2017-01-04 ENCOUNTER — Encounter: Payer: Self-pay | Admitting: Sports Medicine

## 2017-01-04 DIAGNOSIS — I739 Peripheral vascular disease, unspecified: Secondary | ICD-10-CM | POA: Diagnosis not present

## 2017-01-04 DIAGNOSIS — Z7901 Long term (current) use of anticoagulants: Secondary | ICD-10-CM

## 2017-01-04 DIAGNOSIS — B351 Tinea unguium: Secondary | ICD-10-CM | POA: Diagnosis not present

## 2017-01-04 NOTE — Progress Notes (Signed)
  Patient ID: Amber Cline, female   DOB: 05-Dec-1926, 83 y.o.   MRN: [id number] Subjective: Amber Cline is a 83 y.o. female patient seen today in office with complaint of painful thickened and elongated toenails; unable to trim. Patient denies any changes with medical history since last visit. States that she can not trim her nails and needs this appointment. Patient has no other pedal complaints at this time.   Patient Active Problem List   Diagnosis Date Noted  . Angina pectoris (HCC) 06/06/2015  . Long term current use of anticoagulant 06/06/2015  . Chronic atrial fibrillation (HCC) 06/06/2015  . HCD (hypertensive cardiovascular disease) 06/06/2015    Current Outpatient Prescriptions on File Prior to Visit  Medication Sig Dispense Refill  . apixaban (ELIQUIS) 2.5 MG TABS tablet Take 2.5 mg by mouth 2 (two) times daily.    . candesartan (ATACAND) 32 MG tablet Take 32 mg by mouth daily.     . clindamycin (CLEOCIN) 150 MG capsule TAKE ALL 4 CAPSULES 1 HOUR BEFORE ANY DENTAL APPOINTMENT  12  . diltiazem (TIAZAC) 240 MG 24 hr capsule Take 1 capsule (240 mg total) by mouth daily. 90 capsule 2  . levothyroxine (SYNTHROID) 125 MCG tablet Take 125 mcg by mouth daily.    . metoprolol tartrate (LOPRESSOR) 25 MG tablet TAKE 1/2 TABLET (12.5 MG) BY MOUTH TWICE DAILY  0  . nitroGLYCERIN (NITROSTAT) 0.4 MG SL tablet Place 0.4 mg under the tongue every 5 (five) minutes x 3 doses as needed for chest pain.    . Vitamin D, Ergocalciferol, (DRISDOL) 50000 units CAPS capsule Take 50,000 Units by mouth every 7 (seven) days.      No current facility-administered medications on file prior to visit.     Allergies  Allergen Reactions  . Peanut Oil   . Penicillins   . Cortisone Other (See Comments)    "just cant tolerate it"    Objective: Physical Exam  General: Well developed, nourished, no acute distress, awake, alert and oriented x 3  Vascular: Dorsalis pedis artery 0/4 bilateral, Posterior  tibial artery 1/4 faint bilateral, skin temperature warm to warm proximal to distal bilateral lower extremities, + varicosities,  Trace edema bilateral ankles, Decreased pedal hair present bilateral.  Neurological: Gross sensation present via light touch bilateral.   Dermatological: Skin is warm, dry, and supple bilateral, Nails 1-10 are tender, long, thick, and discolored with moderate subungal debris, no webspace macerations present bilateral, no open lesions present bilateral, no callus/corns/hyperkeratotic tissue present bilateral. No signs of infection bilateral.  Musculoskeletal:  Hammertoe and bunion deformities noted bilateral. Muscular strength within normal limits without pain on range of motion. No pain with calf compression bilateral.  Assessment and Plan:  Problem List Items Addressed This Visit    None    Visit Diagnoses    Dermatophytosis of nail    -  Primary   PVD (peripheral vascular disease) (HCC)       Anticoagulant long-term use         -Examined patient.  -Discussed treatment options for painful mycotic nails. -Mechanically debrided and reduced mycotic nails with sterile nail nipper and dremel nail file without incident. -ABN signed  -Recommended good supportive shoes daily for foot type -Recommend continue with compression stockings and lower leg elevation to assist with edema control -Patient to return in 4 months/as needed for follow up evaluation or sooner if symptoms worsen.  Amber Cline, DPM

## 2017-01-22 DIAGNOSIS — Z23 Encounter for immunization: Secondary | ICD-10-CM | POA: Diagnosis not present

## 2017-02-14 DIAGNOSIS — I10 Essential (primary) hypertension: Secondary | ICD-10-CM | POA: Diagnosis not present

## 2017-02-14 DIAGNOSIS — E039 Hypothyroidism, unspecified: Secondary | ICD-10-CM | POA: Diagnosis not present

## 2017-02-14 DIAGNOSIS — I4891 Unspecified atrial fibrillation: Secondary | ICD-10-CM | POA: Diagnosis not present

## 2017-02-14 DIAGNOSIS — M25561 Pain in right knee: Secondary | ICD-10-CM | POA: Diagnosis not present

## 2017-02-15 ENCOUNTER — Ambulatory Visit: Payer: Medicare Other | Admitting: Cardiology

## 2017-02-15 NOTE — Progress Notes (Deleted)
Cardiology Office Note:    Date:  02/15/2017   ID:  Amber Cline, DOB [birth year], MRN [id number]  PCP:  Eloisa NorthernAmin, Saad, MD  Cardiologist:  Norman HerrlichBrian Charice Zuno, MD    Referring MD: Eloisa NorthernAmin, Saad, MD    ASSESSMENT:    No diagnosis found. PLAN:    In order of problems listed above:  1. ***   Next appointment: ***   Medication Adjustments/Labs and Tests Ordered: Current medicines are reviewed at length with the patient today.  Concerns regarding medicines are outlined above.  No orders of the defined types were placed in this encounter.  No orders of the defined types were placed in this encounter.   No chief complaint on file.   History of Present Illness:    Amber Cline is a 83 y.o. female with a hx of angina, Chronic Atrial Fibrillation with anticoagulation, HTN and edema last seen 3 months ago.. Compliance with diet, lifestyle and medications: *** Past Medical History:  Diagnosis Date  . Angina pectoris (HCC) 06/06/2015   Overview:  With normal coronary arteriography 2006  Overview:  With normal coronary arteriography 2006  . Chronic atrial fibrillation (HCC) 06/06/2015   Overview:  CHADS2 vasc score= 6  Overview:  CHADS2 vasc score= 6  . HCD (hypertensive cardiovascular disease) 06/06/2015  . Long term current use of anticoagulant 06/06/2015    Past Surgical History:  Procedure Laterality Date  . CHOLECYSTECTOMY    . HAND SURGERY    . LITHOTRIPSY      Current Medications: No outpatient medications have been marked as taking for the 02/15/17 encounter (Appointment) with Baldo DaubMunley, Paola Aleshire J, MD.     Allergies:   Peanut oil; Penicillins; and Cortisone   Social History   Socioeconomic History  . Marital status: Widowed    Spouse name: Not on file  . Number of children: Not on file  . Years of education: Not on file  . Highest education level: Not on file  Social Needs  . Financial resource strain: Not on file  . Food insecurity - worry: Not on file  . Food  insecurity - inability: Not on file  . Transportation needs - medical: Not on file  . Transportation needs - non-medical: Not on file  Occupational History  . Not on file  Tobacco Use  . Smoking status: Never Smoker  . Smokeless tobacco: Never Used  Substance and Sexual Activity  . Alcohol use: No  . Drug use: No  . Sexual activity: Not on file  Other Topics Concern  . Not on file  Social History Narrative  . Not on file     Family History: The patient's ***family history includes Deafness in her mother; Heart disease in her brother, father, and paternal grandfather. ROS:   Please see the history of present illness.    All other systems reviewed and are negative.  EKGs/Labs/Other Studies Reviewed:    The following studies were reviewed today:  EKG:  EKG ordered today.  The ekg ordered today demonstrates *** Study Highlights Holter monitor  Date of test:                 11/29/2016 Duration of test:           48 hours Indication:                    Atrial fibrillation, heart rate variability Ordering physician:     Dr. Dulce SellarMunley Interpreting physician: Dr. Dulce SellarMunley Baseline rhythm: Atrial fibrillation  with a controlled rate Minimum heart rate 36 average heart rate 68 maximal heart rate 122 bpm Atrial arrhythmia: Atrial fibrillation throughout, longest sustained bradycardia 51 bpm. Ventricular arrhythmia: Less than 1% burden, 1141 single PVCs without couplets or triplets. Conduction abnormality: None, longest R to R 2.5 interval seconds QT interval: Normal, QTC average 418 ms Symptoms: None Conclusion: Atrial fibrillation with a controlled rate, the patient will continue her current rate suppressant medication    Recent Labs: No results found for requested labs within last 8760 hours.  Recent Lipid Panel No results found for: CHOL, TRIG, HDL, CHOLHDL, VLDL, LDLCALC, LDLDIRECT  Physical Exam:    VS:  There were no vitals taken for this visit.    Wt Readings from Last 3  Encounters:  11/16/16 132 lb 6.4 oz (60.1 kg)     GEN: *** Well nourished, well developed in no acute distress HEENT: Normal NECK: No JVD; No carotid bruits LYMPHATICS: No lymphadenopathy CARDIAC: ***RRR, no murmurs, rubs, gallops RESPIRATORY:  Clear to auscultation without rales, wheezing or rhonchi  ABDOMEN: Soft, non-tender, non-distended MUSCULOSKELETAL:  No edema; No deformity  SKIN: Warm and dry NEUROLOGIC:  Alert and oriented x 3 PSYCHIATRIC:  Normal affect    Signed, Norman HerrlichBrian Jamar Weatherall, MD  02/15/2017 8:56 AM    [facility]

## 2017-02-20 NOTE — Progress Notes (Signed)
Cardiology Office Note:    Date:  02/21/2017   ID:  Amber Cline Creer, DOB 1927-02-10, MRN [id number]  PCP:  Eloisa NorthernAmin, Saad, MD  Cardiologist:  Norman HerrlichBrian Asta Corbridge, MD    Referring MD: Eloisa NorthernAmin, Saad, MD    ASSESSMENT:    1. Chronic atrial fibrillation (HCC)   2. Hypertensive heart disease without heart failure   3. Long term current use of anticoagulant    PLAN:    In order of problems listed above:  1. Stable continue current therapy beta-blocker calcium channel blocker for rate control.  She began to screen her heart rates at home with a digital blood pressure cuff the family consider buying I phone adapter to record heart rhythms and her bradycardia is suggested she will need either undergo an extended ambulatory external loop recorder or implantable loop recorder as she is at risk for bradycardia.  She will continue anticoagulation 2. Stable continue current anticoagulants and blood pressure medication 3. Stable continue current anticoagulant   Next appointment: 3 months   Medication Adjustments/Labs and Tests Ordered: Current medicines are reviewed at length with the patient today.  Concerns regarding medicines are outlined above.  No orders of the defined types were placed in this encounter.  No orders of the defined types were placed in this encounter.   No chief complaint on file.   History of Present Illness:    Amber Cline Brunet is a 83 y.o. female with a hx of chronic atrial fibrillation, angina with normal coronary arteriography and hypertension last seen 3 months ago after [facility] admission with rapid rate.. Compliance with diet, lifestyle and medications: Yes Overall she is done well except today she had an episode of weakness felt as if she may faint it was brief transient no vertigo associated with this and no recurrence.  Recently seen in the office and she had rapid atrial fibrillation she was given additional suppressive therapy and a Holter monitor showed good heart rate  control without bradycardia.  I asked her to begin to screen her heart rate at home with a digital blood pressure cuff contact me if the rates are low and asked her family to consider buying I phone adapter to follow her heart rates at home looking for bradycardia that would cause us to consider pacemaker therapy in addition to her suppressive therapy for atrial fibrillation.  This was a single isolated brief episode it was momentary she has had no palpitations syncope TIA or bleeding complications of her anticoagulant Past Medical History:  Diagnosis Date  . Angina pectoris (HCC) 06/06/2015   Overview:  With normal coronary arteriography 2006  Overview:  With normal coronary arteriography 2006  . Chronic atrial fibrillation (HCC) 06/06/2015   Overview:  CHADS2 vasc score= 6  Overview:  CHADS2 vasc score= 6  . HCD (hypertensive cardiovascular disease) 06/06/2015  . Long term current use of anticoagulant 06/06/2015    Past Surgical History:  Procedure Laterality Date  . CHOLECYSTECTOMY    . HAND SURGERY    . LITHOTRIPSY      Current Medications: No outpatient medications have been marked as taking for the 02/22/17 encounter (Appointment) with Baldo DaubMunley, Alexys Lobello J, MD.     Allergies:   Peanut oil; Penicillins; and Cortisone   Social History   Socioeconomic History  . Marital status: Widowed    Spouse name: Not on file  . Number of children: Not on file  . Years of education: Not on file  . Highest education level: Not on file  Social Needs  . Financial resource strain: Not on file  . Food insecurity - worry: Not on file  . Food insecurity - inability: Not on file  . Transportation needs - medical: Not on file  . Transportation needs - non-medical: Not on file  Occupational History  . Not on file  Tobacco Use  . Smoking status: Never Smoker  . Smokeless tobacco: Never Used  Substance and Sexual Activity  . Alcohol use: No  . Drug use: No  . Sexual activity: Not on file  Other Topics  Concern  . Not on file  Social History Narrative  . Not on file     Family History: The patient's family history includes Deafness in her mother; Heart disease in her brother, father, and paternal grandfather. ROS:   Please see the history of present illness.    All other systems reviewed and are negative.  EKGs/Labs/Other Studies Reviewed:    The following studies were reviewed today  Holter monitor: Study Highlights  Date of test:                 11/29/2016 Duration of test:           48 hours Indication:                    Atrial fibrillation, heart rate variability Ordering physician:     Dr. Dulce SellarMunley Interpreting physician: Dr. Dulce SellarMunley Baseline rhythm: Atrial fibrillation with a controlled rate Minimum heart rate 36 average heart rate 68 maximal heart rate 122 bpm Atrial arrhythmia: Atrial fibrillation throughout, longest sustained bradycardia 51 bpm. Ventricular arrhythmia: Less than 1% burden, 1141 single PVCs without couplets or triplets. Conduction abnormality: None, longest R to R 2.5 interval seconds QT interval: Normal, QTC average 418 ms Symptoms: None Conclusion: Atrial fibrillation with a controlled rate, the patient will continue her current rate suppressant medication    Recent Labs: No results found for requested labs within last 8760 hours.  Recent Lipid Panel No results found for: CHOL, TRIG, HDL, CHOLHDL, VLDL, LDLCALC, LDLDIRECT  Physical Exam:    VS:  There were no vitals taken for this visit.    Wt Readings from Last 3 Encounters:  11/16/16 132 lb 6.4 oz (60.1 kg)     GEN:  Well nourished, well developed in no acute distress HEENT: Normal NECK: No JVD; No carotid bruits LYMPHATICS: No lymphadenopathy CARDIAC: irr Irr variable S1, no murmurs, rubs, gallops RESPIRATORY:  Clear to auscultation without rales, wheezing or rhonchi  ABDOMEN: Soft, non-tender, non-distended MUSCULOSKELETAL:  No edema; No deformity  SKIN: Warm and dry NEUROLOGIC:   Alert and oriented x 3 PSYCHIATRIC:  Normal affect    Signed, Norman HerrlichBrian Laurance Heide, MD  02/21/2017 4:34 PM    [facility]

## 2017-02-22 ENCOUNTER — Ambulatory Visit (INDEPENDENT_AMBULATORY_CARE_PROVIDER_SITE_OTHER): Payer: Medicare Other | Admitting: Cardiology

## 2017-02-22 ENCOUNTER — Encounter: Payer: Self-pay | Admitting: Cardiology

## 2017-02-22 VITALS — BP 130/84 | HR 81 | Ht 66.0 in | Wt 135.0 lb

## 2017-02-22 DIAGNOSIS — I119 Hypertensive heart disease without heart failure: Secondary | ICD-10-CM

## 2017-02-22 DIAGNOSIS — Z7901 Long term (current) use of anticoagulants: Secondary | ICD-10-CM

## 2017-02-22 DIAGNOSIS — I482 Chronic atrial fibrillation, unspecified: Secondary | ICD-10-CM

## 2017-02-22 NOTE — Patient Instructions (Addendum)
Medication Instructions:  Your physician recommends that you continue on your current medications as directed. Please refer to the Current Medication list given to you today.   Labwork: None  Testing/Procedures: None  Follow-Up: Your physician wants you to follow-up in: 3 months.  You will receive a reminder letter in the mail two months in advance. If you don't receive a letter, please call our office to schedule the follow-up appointment.   Any Other Special Instructions Will Be Listed Below (If Applicable).     If you need a refill on your cardiac medications before your next appointment, please call your pharmacy.    KardiaMobile [email or web address]        FDA-cleared, clinical grade mobile EKG monitor: Lourena SimmondsKardia is the most clinically-validated mobile EKG used by the world's leading cardiac care medical professionals With Basic service, know instantly if your heart rhythm is normal or if atrial fibrillation is detected, and email the last single EKG recording to yourself or your doctor Premium service, available for purchase through the Kardia app for $9.99 per month or $99 per year, includes unlimited history and storage of your EKG recordings, a monthly EKG summary report to share with your doctor, along with the ability to track your blood pressure, activity and weight Includes one KardiaMobile phone clip FREE SHIPPING: Standard delivery 1-3 business days. Orders placed by 11:00am PST will ship that afternoon. Otherwise, will ship next business day. All orders ship via [employer] Priority Mail from [place], [place]         Introducing AvillaKardiaBand, the new wearable EKG by [employer]. Venetia MaxonKardiaBand replaces your original Apple Watch band. The first of its kind, FDA-cleared KardiaBand provides accurate and instant analysis for detecting atrial fibrillation (AF) and normal sinus rhythm in an EKG. Simply place your thumb on the integrated KardiaBand sensor to take  a medical-grade EKG in just 30 seconds. Results appear instantly on your Apple Watch. Venetia MaxonKardiaBand is available today for just $199. KardiaBand features are designed exclusively for use with The M[employer] CompanyKardia Premium membership - $99 year. The Goodrich CorporationKardia Premium app for [employer]pple Watch includes [employer]'s revolutionary SmartRhythm monitoring feature. SmartRhythm monitoring uses an intelligent neural network that runs directly on the Cent[employer] Corporationpple Watch, constantly acquiring data from the watch's heart rate sensor and its accelerometer. SmartRhythm compares your heart rate to what it expects from your minute-by-minute level of activity. When the network sees a pattern of heart rate and activity that it does not expect, it notifies you to take an EKG. With Dor[place]villeKardiaBand, peace of mind is just an EKG away.  .  Check your HR and BP daily, call if rates are often < 70 BPM or if < 55 BPM

## 2017-05-03 DIAGNOSIS — Z961 Presence of intraocular lens: Secondary | ICD-10-CM | POA: Diagnosis not present

## 2017-05-03 DIAGNOSIS — H26493 Other secondary cataract, bilateral: Secondary | ICD-10-CM | POA: Diagnosis not present

## 2017-05-09 ENCOUNTER — Ambulatory Visit: Payer: Medicare Other | Admitting: Sports Medicine

## 2017-05-21 DIAGNOSIS — I10 Essential (primary) hypertension: Secondary | ICD-10-CM | POA: Diagnosis not present

## 2017-05-21 DIAGNOSIS — M25561 Pain in right knee: Secondary | ICD-10-CM | POA: Diagnosis not present

## 2017-05-21 DIAGNOSIS — E039 Hypothyroidism, unspecified: Secondary | ICD-10-CM | POA: Diagnosis not present

## 2017-05-21 DIAGNOSIS — I4891 Unspecified atrial fibrillation: Secondary | ICD-10-CM | POA: Diagnosis not present

## 2017-05-29 NOTE — Progress Notes (Signed)
Cardiology Office Note:    Date:  05/30/2017   ID:  Amber Cline, DOB 12-22-1926, MRN [id number]  PCP:  Eloisa NorthernAmin, Saad, MD  Cardiologist:  Norman HerrlichBrian Kalea Perine, MD    Referring MD: Eloisa NorthernAmin, Saad, MD    ASSESSMENT:    1. Chronic atrial fibrillation (HCC)   2. Long term current use of anticoagulant   3. Hypertensive heart disease without heart failure    PLAN:    In order of problems listed above:  1. Stable rate controlled continue her current treatment with rate limiting calcium channel blocker beta-blocker I asked her and her son to self monitor heart rate and blood pressure at home daily.  She will continue her current anti-coagulant renal dose reduced 2. Stable continue current anticoagulant 3. Stable blood pressure continue current treatment including ARB   Next appointment: 6 months   Medication Adjustments/Labs and Tests Ordered: Current medicines are reviewed at length with the patient today.  Concerns regarding medicines are outlined above.  No orders of the defined types were placed in this encounter.  No orders of the defined types were placed in this encounter.   Chief Complaint  Patient presents with  . Atrial Fibrillation  . Anticoagulation    History of Present Illness:    Amber ChamberJeane E Mcmaster is a 84 y.o. female with a hx of chronic atrial fibrillation, angina with normal coronary arteriography and hypertension   ASSESSMENT:  02/22/17   1. Chronic atrial fibrillation (HCC)   2. Hypertensive heart disease without heart failure   3. Long term current use of anticoagulant    PLAN:    1.    Stable continue current therapy beta-blocker calcium channel blocker for rate control.  She began to screen her heart rates at home with a digital blood pressure cuff the family consider buying I phone adapter to record heart rhythms and her bradycardia is suggested she will need either undergo an extended ambulatory external loop recorder or implantable loop recorder as she is  at risk for bradycardia.  She will continue anticoagulation 4. Stable continue current anticoagulants and blood pressure medication 5. Stable continue current anticoagulant     last seen 3 months ago. Compliance with diet, lifestyle and medications: Yes but unfortunately does not check home blood pressure and heart rate She has had no recurrent episodes of syncope chest pain shortness of breath palpitations TIA or bleeding of her anticoagulant.  Lab work this week with her PCP shows normal creatinine BUN glucose and TSH. Past Medical History:  Diagnosis Date  . Angina pectoris (HCC) 06/06/2015   Overview:  With normal coronary arteriography 2006  Overview:  With normal coronary arteriography 2006  . Chronic atrial fibrillation (HCC) 06/06/2015   Overview:  CHADS2 vasc score= 6  Overview:  CHADS2 vasc score= 6  . HCD (hypertensive cardiovascular disease) 06/06/2015  . Long term current use of anticoagulant 06/06/2015    Past Surgical History:  Procedure Laterality Date  . CHOLECYSTECTOMY    . HAND SURGERY    . LITHOTRIPSY      Current Medications: Current Meds  Medication Sig  . apixaban (ELIQUIS) 2.5 MG TABS tablet Take 2.5 mg by mouth 2 (two) times daily.  Marland Kitchen. Apoaequorin (PREVAGEN PO) Take by mouth daily.  . candesartan (ATACAND) 32 MG tablet Take 32 mg by mouth daily.   . clindamycin (CLEOCIN) 150 MG capsule TAKE ALL 4 CAPSULES 1 HOUR BEFORE ANY DENTAL APPOINTMENT  . Coenzyme Q10 (CO Q 10 PO) Take by mouth  daily.  . diltiazem (TIAZAC) 240 MG 24 hr capsule Take 1 capsule (240 mg total) by mouth daily.  Marland Kitchen levothyroxine (SYNTHROID) 125 MCG tablet Take 125 mcg by mouth daily.  . metoprolol tartrate (LOPRESSOR) 25 MG tablet TAKE 1/2 TABLET (12.5 MG) BY MOUTH TWICE DAILY  . nitroGLYCERIN (NITROSTAT) 0.4 MG SL tablet Place 0.4 mg under the tongue every 5 (five) minutes x 3 doses as needed for chest pain.  . Vitamin D, Ergocalciferol, (DRISDOL) 50000 units CAPS capsule Take 50,000 Units by  mouth every 7 (seven) days.      Allergies:   Peanut oil; Penicillins; and Cortisone   Social History   Socioeconomic History  . Marital status: Widowed    Spouse name: None  . Number of children: None  . Years of education: None  . Highest education level: None  Social Needs  . Financial resource strain: None  . Food insecurity - worry: None  . Food insecurity - inability: None  . Transportation needs - medical: None  . Transportation needs - non-medical: None  Occupational History  . None  Tobacco Use  . Smoking status: Never Smoker  . Smokeless tobacco: Never Used  Substance and Sexual Activity  . Alcohol use: No  . Drug use: No  . Sexual activity: None  Other Topics Concern  . None  Social History Narrative  . None     Family History: The patient's family history includes Deafness in her mother; Heart disease in her brother, father, and paternal grandfather. ROS:   Please see the history of present illness.    All other systems reviewed and are negative.  EKGs/Labs/Other Studies Reviewed:    The following studies were reviewed today:  Recent Labs: No results found for requested labs within last 8760 hours.  Recent Lipid Panel No results found for: CHOL, TRIG, HDL, CHOLHDL, VLDL, LDLCALC, LDLDIRECT  Physical Exam:    VS:  BP 138/78 (BP Location: Left Arm, Patient Position: Sitting, Cuff Size: Normal)   Pulse 82   Ht 5\' 6"  (1.676 m)   Wt 136 lb 1.9 oz (61.7 kg)   SpO2 98%   BMI 21.97 kg/m     Wt Readings from Last 3 Encounters:  05/30/17 136 lb 1.9 oz (61.7 kg)  02/22/17 135 lb (61.2 kg)  11/16/16 132 lb 6.4 oz (60.1 kg)     GEN:  Well nourished, well developed in no acute distress HEENT: Normal NECK: No JVD; No carotid bruits LYMPHATICS: No lymphadenopathy CARDIAC: Irr Irr  1-2/ 6  SEM aortic area no murmurs, rubs, gallops RESPIRATORY:  Clear to auscultation without rales, wheezing or rhonchi  ABDOMEN: Soft, non-tender,  non-distended MUSCULOSKELETAL:  No edema; No deformity  SKIN: Warm and dry NEUROLOGIC:  Alert and oriented x 3 PSYCHIATRIC:  Normal affect    Signed, Norman Herrlich, MD  05/30/2017 4:33 PM    [facility]

## 2017-05-30 ENCOUNTER — Encounter: Payer: Self-pay | Admitting: Cardiology

## 2017-05-30 ENCOUNTER — Ambulatory Visit (INDEPENDENT_AMBULATORY_CARE_PROVIDER_SITE_OTHER): Payer: Medicare Other | Admitting: Cardiology

## 2017-05-30 VITALS — BP 138/78 | HR 82 | Ht 66.0 in | Wt 136.1 lb

## 2017-05-30 DIAGNOSIS — I482 Chronic atrial fibrillation, unspecified: Secondary | ICD-10-CM

## 2017-05-30 DIAGNOSIS — I119 Hypertensive heart disease without heart failure: Secondary | ICD-10-CM

## 2017-05-30 DIAGNOSIS — Z7901 Long term (current) use of anticoagulants: Secondary | ICD-10-CM

## 2017-05-30 NOTE — Patient Instructions (Addendum)

## 2017-06-01 ENCOUNTER — Ambulatory Visit: Payer: Medicare Other | Admitting: Sports Medicine

## 2017-06-08 ENCOUNTER — Other Ambulatory Visit: Payer: Self-pay

## 2017-06-08 MED ORDER — METOPROLOL TARTRATE 25 MG PO TABS: ORAL_TABLET | ORAL | 11 refills | Status: DC

## 2017-06-14 DIAGNOSIS — H26493 Other secondary cataract, bilateral: Secondary | ICD-10-CM | POA: Diagnosis not present

## 2017-06-21 DIAGNOSIS — R1032 Left lower quadrant pain: Secondary | ICD-10-CM | POA: Diagnosis not present

## 2017-06-22 ENCOUNTER — Ambulatory Visit: Payer: Medicare Other | Admitting: Sports Medicine

## 2017-07-11 ENCOUNTER — Encounter: Payer: Self-pay | Admitting: Sports Medicine

## 2017-07-11 ENCOUNTER — Ambulatory Visit (INDEPENDENT_AMBULATORY_CARE_PROVIDER_SITE_OTHER): Payer: Medicare Other | Admitting: Sports Medicine

## 2017-07-11 DIAGNOSIS — B351 Tinea unguium: Secondary | ICD-10-CM

## 2017-07-11 DIAGNOSIS — Z7901 Long term (current) use of anticoagulants: Secondary | ICD-10-CM

## 2017-07-11 DIAGNOSIS — I739 Peripheral vascular disease, unspecified: Secondary | ICD-10-CM | POA: Diagnosis not present

## 2017-07-11 NOTE — Progress Notes (Signed)
Patient ID: Amber ChamberJeane E Marling, female   DOB: 06-18-1926, 84 y.o.   MRN: [id number] Subjective: Amber Cline is a 84 y.o. female patient seen today in office with complaint of painful thickened and elongated toenails; unable to trim. Patient denies any changes with medical history since last visit.  No other pedal complaints at this time.  Patient Active Problem List   Diagnosis Date Noted  . Angina pectoris (HCC) 06/06/2015  . Long term current use of anticoagulant 06/06/2015  . Chronic atrial fibrillation (HCC) 06/06/2015  . HCD (hypertensive cardiovascular disease) 06/06/2015    Current Outpatient Medications on File Prior to Visit  Medication Sig Dispense Refill  . apixaban (ELIQUIS) 2.5 MG TABS tablet Take 2.5 mg by mouth 2 (two) times daily.    Marland Kitchen. Apoaequorin (PREVAGEN PO) Take by mouth daily.    . candesartan (ATACAND) 32 MG tablet Take 32 mg by mouth daily.     . clindamycin (CLEOCIN) 150 MG capsule TAKE ALL 4 CAPSULES 1 HOUR BEFORE ANY DENTAL APPOINTMENT  12  . Coenzyme Q10 (CO Q 10 PO) Take by mouth daily.    Marland Kitchen. diltiazem (TIAZAC) 240 MG 24 hr capsule Take 1 capsule (240 mg total) by mouth daily. 90 capsule 2  . levothyroxine (SYNTHROID) 125 MCG tablet Take 125 mcg by mouth daily.    . metoprolol tartrate (LOPRESSOR) 25 MG tablet TAKE 1/2 TABLET (12.5 MG) BY MOUTH TWICE DAILY 30 tablet 11  . nitroGLYCERIN (NITROSTAT) 0.4 MG SL tablet Place 0.4 mg under the tongue every 5 (five) minutes x 3 doses as needed for chest pain.    . Vitamin D, Ergocalciferol, (DRISDOL) 50000 units CAPS capsule Take 50,000 Units by mouth every 7 (seven) days.      No current facility-administered medications on file prior to visit.     Allergies  Allergen Reactions  . Peanut Oil   . Penicillins   . Cortisone Other (See Comments)    "just cant tolerate it"    Objective: Physical Exam  General: Well developed, nourished, no acute distress, awake, alert and oriented x 3  Vascular: Dorsalis  pedis artery 0/4 bilateral, Posterior tibial artery 1/4 faint bilateral, skin temperature warm to warm proximal to distal bilateral lower extremities, + varicosities,  Trace edema bilateral ankles, Decreased pedal hair present bilateral.  Neurological: Gross sensation present via light touch bilateral.   Dermatological: Skin is warm, dry, and supple bilateral, Nails 1-10 are tender, long, thick, and discolored with moderate subungal debris, no webspace macerations present bilateral, no open lesions present bilateral, no callus/corns/hyperkeratotic tissue present bilateral. No signs of infection bilateral.  Musculoskeletal:  Hammertoe and bunion deformities noted bilateral. Muscular strength within normal limits without pain on range of motion. No pain with calf compression bilateral.  Assessment and Plan:  Problem List Items Addressed This Visit    None    Visit Diagnoses    Dermatophytosis of nail    -  Primary   PVD (peripheral vascular disease) (HCC)       Anticoagulant long-term use         -Examined patient.  -Discussed treatment options for painful mycotic nails. -Mechanically debrided and reduced mycotic nails with sterile nail nipper and dremel nail file without incident.  -Recommended good supportive shoes daily for foot type -Recommend continue with compression stockings and lower leg elevation to assist with edema control -Patient to return in 3-4 months/as needed for follow up evaluation or sooner if symptoms worsen.  Asencion Islamitorya Gabriell Daigneault, DPM

## 2017-07-24 ENCOUNTER — Other Ambulatory Visit: Payer: Self-pay

## 2017-07-24 MED ORDER — APIXABAN 2.5 MG PO TABS: 2.5000 mg | ORAL_TABLET | Freq: Two times a day (BID) | ORAL | 2 refills | Status: DC

## 2017-07-26 DIAGNOSIS — H26491 Other secondary cataract, right eye: Secondary | ICD-10-CM | POA: Diagnosis not present

## 2017-08-27 DIAGNOSIS — I4891 Unspecified atrial fibrillation: Secondary | ICD-10-CM | POA: Diagnosis not present

## 2017-08-27 DIAGNOSIS — I10 Essential (primary) hypertension: Secondary | ICD-10-CM | POA: Diagnosis not present

## 2017-08-27 DIAGNOSIS — E039 Hypothyroidism, unspecified: Secondary | ICD-10-CM | POA: Diagnosis not present

## 2017-09-06 DIAGNOSIS — H26492 Other secondary cataract, left eye: Secondary | ICD-10-CM | POA: Diagnosis not present

## 2017-09-10 ENCOUNTER — Telehealth: Payer: Self-pay

## 2017-09-10 MED ORDER — DILTIAZEM HCL ER BEADS 240 MG PO CP24: 240.0000 mg | ORAL_CAPSULE | Freq: Every day | ORAL | 2 refills | Status: DC

## 2017-09-10 NOTE — Telephone Encounter (Signed)
Rx for diltiazem 240 mg one capsule everyday #90 sent to [facility] as requested.

## 2017-10-10 ENCOUNTER — Ambulatory Visit: Payer: Medicare Other | Admitting: Sports Medicine

## 2017-10-26 ENCOUNTER — Ambulatory Visit (INDEPENDENT_AMBULATORY_CARE_PROVIDER_SITE_OTHER): Payer: Medicare Other | Admitting: Sports Medicine

## 2017-10-26 ENCOUNTER — Encounter: Payer: Self-pay | Admitting: Sports Medicine

## 2017-10-26 VITALS — BP 150/93 | HR 90 | Resp 15

## 2017-10-26 DIAGNOSIS — I739 Peripheral vascular disease, unspecified: Secondary | ICD-10-CM

## 2017-10-26 DIAGNOSIS — B351 Tinea unguium: Secondary | ICD-10-CM

## 2017-10-26 DIAGNOSIS — Z7901 Long term (current) use of anticoagulants: Secondary | ICD-10-CM

## 2017-10-26 NOTE — Progress Notes (Signed)
Patient ID: Amber ChamberJeane E Cline, female   DOB: Jan 10, 1927, 84 y.o.   MRN: [id number] Subjective: Amber Cline is a 84 y.o. female patient seen today in office with complaint of painful thickened and elongated toenails; unable to trim. Patient denies any changes with medical history since last visit.  Admits to some numbness otherwise denies any other symptoms.  No other pedal complaints at this time.  Patient Active Problem List   Diagnosis Date Noted  . Angina pectoris (HCC) 06/06/2015  . Long term current use of anticoagulant 06/06/2015  . Chronic atrial fibrillation (HCC) 06/06/2015  . HCD (hypertensive cardiovascular disease) 06/06/2015    Current Outpatient Medications on File Prior to Visit  Medication Sig Dispense Refill  . apixaban (ELIQUIS) 2.5 MG TABS tablet Take 1 tablet (2.5 mg total) by mouth 2 (two) times daily. 180 tablet 2  . Apoaequorin (PREVAGEN PO) Take by mouth daily.    . candesartan (ATACAND) 32 MG tablet Take 32 mg by mouth daily.     . clindamycin (CLEOCIN) 150 MG capsule TAKE ALL 4 CAPSULES 1 HOUR BEFORE ANY DENTAL APPOINTMENT  12  . Coenzyme Q10 (CO Q 10 PO) Take by mouth daily.    Marland Kitchen. diltiazem (TIAZAC) 240 MG 24 hr capsule Take 1 capsule (240 mg total) by mouth daily. 90 capsule 2  . levothyroxine (SYNTHROID) 125 MCG tablet Take 125 mcg by mouth daily.    . metoprolol tartrate (LOPRESSOR) 25 MG tablet TAKE 1/2 TABLET (12.5 MG) BY MOUTH TWICE DAILY 30 tablet 11  . nitroGLYCERIN (NITROSTAT) 0.4 MG SL tablet Place 0.4 mg under the tongue every 5 (five) minutes x 3 doses as needed for chest pain.    . Vitamin D, Ergocalciferol, (DRISDOL) 50000 units CAPS capsule Take 50,000 Units by mouth every 7 (seven) days.      No current facility-administered medications on file prior to visit.     Allergies  Allergen Reactions  . Peanut Oil   . Penicillins   . Cortisone Other (See Comments)    "just cant tolerate it"    Objective: Physical Exam  General: Well  developed, nourished, no acute distress, awake, alert and oriented x 3  Vascular: Dorsalis pedis artery 0/4 bilateral, Posterior tibial artery 1/4 faint bilateral, skin temperature warm to warm proximal to distal bilateral lower extremities, + varicosities,  Trace edema bilateral ankles, Decreased pedal hair present bilateral.  Neurological: Gross sensation present via light touch bilateral.  Subjective numbness to both feet.  Dermatological: Skin is warm, dry, and supple bilateral, Nails 1-10 are tender, long, thick, and discolored with moderate subungal debris, no webspace macerations present bilateral, no open lesions present bilateral, no callus/corns/hyperkeratotic tissue present bilateral. No signs of infection bilateral.  Musculoskeletal:  Hammertoe and bunion deformities noted bilateral. Muscular strength within normal limits without pain on range of motion. No pain with calf compression bilateral.  Assessment and Plan:  Problem List Items Addressed This Visit    None    Visit Diagnoses    Dermatophytosis of nail    -  Primary   PVD (peripheral vascular disease) (HCC)       Anticoagulant long-term use         -Examined patient.  -Discussed treatment options for painful mycotic nails. -Mechanically debrided and reduced mycotic nails with sterile nail nipper and dremel nail file without incident.  -Recommended good supportive shoes daily for foot type -Recommend continue with compression stockings and lower leg elevation to assist with edema control -Patient to  return in 3-4 months/as needed for follow up evaluation or sooner if symptoms worsen.  [name], DPM

## 2017-11-27 DIAGNOSIS — J069 Acute upper respiratory infection, unspecified: Secondary | ICD-10-CM | POA: Diagnosis not present

## 2017-12-06 ENCOUNTER — Encounter: Payer: Self-pay | Admitting: Cardiology

## 2017-12-06 ENCOUNTER — Ambulatory Visit (INDEPENDENT_AMBULATORY_CARE_PROVIDER_SITE_OTHER): Payer: Medicare Other | Admitting: Cardiology

## 2017-12-06 VITALS — BP 144/78 | HR 78 | Ht 66.0 in | Wt 130.4 lb

## 2017-12-06 DIAGNOSIS — Z7901 Long term (current) use of anticoagulants: Secondary | ICD-10-CM | POA: Diagnosis not present

## 2017-12-06 DIAGNOSIS — I209 Angina pectoris, unspecified: Secondary | ICD-10-CM

## 2017-12-06 DIAGNOSIS — E039 Hypothyroidism, unspecified: Secondary | ICD-10-CM

## 2017-12-06 DIAGNOSIS — I119 Hypertensive heart disease without heart failure: Secondary | ICD-10-CM

## 2017-12-06 DIAGNOSIS — I482 Chronic atrial fibrillation, unspecified: Secondary | ICD-10-CM

## 2017-12-06 HISTORY — DX: Hypothyroidism, unspecified: E03.9

## 2017-12-06 NOTE — Patient Instructions (Signed)
Medication Instructions:  Your physician recommends that you continue on your current medications as directed. Please refer to the Current Medication list given to you today.   Labwork: None  Testing/Procedures: You had an EKG today.   Follow-Up: Your physician wants you to follow-up in: 6 months. You will receive a reminder letter in the mail two months in advance. If you don't receive a letter, please call our office to schedule the follow-up appointment.   If you need a refill on your cardiac medications before your next appointment, please call your pharmacy.   Thank you for choosing [facility]! Catherine Lockhart, RN [phone]    

## 2017-12-06 NOTE — Progress Notes (Signed)
Cardiology Office Note:    Date:  12/06/2017   ID:  Amber Cline, DOB [birth year], MRN [id number]  PCP:  Eloisa NorthernAmin, Saad, MD  Cardiologist:  Norman HerrlichBrian Kabrina Christiano, MD    Referring MD: Eloisa NorthernAmin, Saad, MD    ASSESSMENT:    1. Chronic atrial fibrillation (HCC)   2. Hypertensive heart disease without heart failure   3. Angina pectoris (HCC)   4. Long term current use of anticoagulant   5. Hypothyroidism (acquired)    PLAN:    In order of problems listed above:  1. Stable rate controlled continue her current beta-blocker calcium channel blocker and reduced dose anticoagulant 2. Stable blood pressure target continue current treatment including ARB recent labs requested for renal function potassium 3. Stable recent flare associated with Zithromax continue current treatment including beta-blocker calcium channel blocker and nitroglycerin as needed 4. Continue her current anticoagulant she has had no bleeding complication and has a moderate stroke risk 5. Stable managed by her PCP   Next appointment: 6 months   Medication Adjustments/Labs and Tests Ordered: Current medicines are reviewed at length with the patient today.  Concerns regarding medicines are outlined above.  No orders of the defined types were placed in this encounter.  No orders of the defined types were placed in this encounter.   No chief complaint on file.   History of Present Illness:    Amber Cline is a 84 y.o. female with a hx of chronic atrial fibrillation, angina with normal coronary arteriography and hypertension  last seen 05/30/17. Compliance with diet, lifestyle and medications: Yes  And generally done well until recently when she had a flulike illness was seen prescribe Zithromax and after taking it the first evening had recurrent episodes of angina at rest each time relieved with nitroglycerin.  She stopped the drug and has had no recurrence.  No edema palpitation shortness of breath syncope or TIA.  She is on  her anticoagulant and no bleeding complication.  Recent labs requested from her PCP office Past Medical History:  Diagnosis Date  . Angina pectoris (HCC) 06/06/2015   Overview:  With normal coronary arteriography 2006  Overview:  With normal coronary arteriography 2006  . Chronic atrial fibrillation (HCC) 06/06/2015   Overview:  CHADS2 vasc score= 6  Overview:  CHADS2 vasc score= 6  . HCD (hypertensive cardiovascular disease) 06/06/2015  . Long term current use of anticoagulant 06/06/2015    Past Surgical History:  Procedure Laterality Date  . CHOLECYSTECTOMY    . HAND SURGERY    . LITHOTRIPSY      Current Medications: No outpatient medications have been marked as taking for the 12/06/17 encounter (Appointment) with Baldo DaubMunley, Ilyssa Grennan J, MD.     Allergies:   Peanut oil; Penicillins; and Cortisone   Social History   Socioeconomic History  . Marital status: Widowed    Spouse name: Not on file  . Number of children: Not on file  . Years of education: Not on file  . Highest education level: Not on file  Occupational History  . Not on file  Social Needs  . Financial resource strain: Not on file  . Food insecurity:    Worry: Not on file    Inability: Not on file  . Transportation needs:    Medical: Not on file    Non-medical: Not on file  Tobacco Use  . Smoking status: Never Smoker  . Smokeless tobacco: Never Used  Substance and Sexual Activity  . Alcohol use: No  .  Drug use: No  . Sexual activity: Not on file  Lifestyle  . Physical activity:    Days per week: Not on file    Minutes per session: Not on file  . Stress: Not on file  Relationships  . Social connections:    Talks on phone: Not on file    Gets together: Not on file    Attends religious service: Not on file    Active member of club or organization: Not on file    Attends meetings of clubs or organizations: Not on file    Relationship status: Not on file  Other Topics Concern  . Not on file  Social History  Narrative  . Not on file     Family History: The patient's family history includes Deafness in her mother; Heart disease in her brother, father, and paternal grandfather. ROS:   Please see the history of present illness.    All other systems reviewed and are negative.  EKGs/Labs/Other Studies Reviewed:    The following studies were reviewed today:  EKG:  EKG ordered today.  The ekg ordered today demonstrates AF and a controlled rate poor r wave progression  Recent Labs: No results found for requested labs within last 8760 hours.  Recent Lipid Panel No results found for: CHOL, TRIG, HDL, CHOLHDL, VLDL, LDLCALC, LDLDIRECT  Physical Exam:    VS:  There were no vitals taken for this visit.    Wt Readings from Last 3 Encounters:  05/30/17 136 lb 1.9 oz (61.7 kg)  02/22/17 135 lb (61.2 kg)  11/16/16 132 lb 6.4 oz (60.1 kg)     GEN:  Well nourished, well developed in no acute distress HEENT: Normal NECK: No JVD; No carotid bruits LYMPHATICS: No lymphadenopathy CARDIAC: Irr  Irr variable S1, no murmurs, rubs, gallops RESPIRATORY:  Clear to auscultation without rales, wheezing or rhonchi  ABDOMEN: Soft, non-tender, non-distended MUSCULOSKELETAL:  No edema; No deformity  SKIN: Warm and dry NEUROLOGIC:  Alert and oriented x 3 PSYCHIATRIC:  Normal affect    Signed, Norman Herrlich, MD  12/06/2017 8:09 AM    [facility]

## 2017-12-17 DIAGNOSIS — Z23 Encounter for immunization: Secondary | ICD-10-CM | POA: Diagnosis not present

## 2017-12-17 DIAGNOSIS — I10 Essential (primary) hypertension: Secondary | ICD-10-CM | POA: Diagnosis not present

## 2017-12-17 DIAGNOSIS — I482 Chronic atrial fibrillation: Secondary | ICD-10-CM | POA: Diagnosis not present

## 2017-12-17 DIAGNOSIS — J0100 Acute maxillary sinusitis, unspecified: Secondary | ICD-10-CM | POA: Diagnosis not present

## 2017-12-17 DIAGNOSIS — E039 Hypothyroidism, unspecified: Secondary | ICD-10-CM | POA: Diagnosis not present

## 2017-12-18 ENCOUNTER — Ambulatory Visit: Payer: Medicare Other | Admitting: Cardiology

## 2018-01-25 ENCOUNTER — Ambulatory Visit: Payer: Medicare Other | Admitting: Sports Medicine

## 2018-02-06 ENCOUNTER — Ambulatory Visit (INDEPENDENT_AMBULATORY_CARE_PROVIDER_SITE_OTHER): Payer: Medicare Other | Admitting: Sports Medicine

## 2018-02-06 ENCOUNTER — Encounter: Payer: Self-pay | Admitting: Sports Medicine

## 2018-02-06 VITALS — BP 146/74 | HR 85 | Resp 16

## 2018-02-06 DIAGNOSIS — I739 Peripheral vascular disease, unspecified: Secondary | ICD-10-CM

## 2018-02-06 DIAGNOSIS — Z7901 Long term (current) use of anticoagulants: Secondary | ICD-10-CM | POA: Diagnosis not present

## 2018-02-06 DIAGNOSIS — B351 Tinea unguium: Secondary | ICD-10-CM | POA: Diagnosis not present

## 2018-02-06 NOTE — Progress Notes (Signed)
Patient ID: Amber Cline, female   DOB: [birth year], 84 y.o.   MRN: [id number] Subjective: Amber Cline is a 84 y.o. female patient seen today in office with complaint of painful thickened and elongated toenails; unable to trim. Patient denies any changes with medical history since last visit.  No other pedal complaints at this time.  Patient Active Problem List   Diagnosis Date Noted  . Hypothyroidism (acquired) 12/06/2017  . Angina pectoris (HCC) 06/06/2015  . Long term current use of anticoagulant 06/06/2015  . Chronic atrial fibrillation 06/06/2015  . Hypertensive heart disease 06/06/2015    Current Outpatient Medications on File Prior to Visit  Medication Sig Dispense Refill  . apixaban (ELIQUIS) 2.5 MG TABS tablet Take 1 tablet (2.5 mg total) by mouth 2 (two) times daily. 180 tablet 2  . Apoaequorin (PREVAGEN PO) Take 1 tablet by mouth daily.     . candesartan (ATACAND) 32 MG tablet Take 32 mg by mouth daily.     . clindamycin (CLEOCIN) 150 MG capsule TAKE ALL 4 CAPSULES 1 HOUR BEFORE ANY DENTAL APPOINTMENT  12  . Coenzyme Q10 (CO Q 10 PO) Take by mouth daily.    Marland Kitchen. diltiazem (TIAZAC) 240 MG 24 hr capsule Take 1 capsule (240 mg total) by mouth daily. 90 capsule 2  . levothyroxine (SYNTHROID) 125 MCG tablet Take 125 mcg by mouth daily.    . metoprolol tartrate (LOPRESSOR) 25 MG tablet TAKE 1/2 TABLET (12.5 MG) BY MOUTH TWICE DAILY 30 tablet 11  . nitroGLYCERIN (NITROSTAT) 0.4 MG SL tablet Place 0.4 mg under the tongue every 5 (five) minutes x 3 doses as needed for chest pain.    . Vitamin D, Ergocalciferol, (DRISDOL) 50000 units CAPS capsule Take 50,000 Units by mouth every 7 (seven) days.      No current facility-administered medications on file prior to visit.     Allergies  Allergen Reactions  . Azithromycin Other (See Comments)    "flu like symptoms"  . Peanut Oil   . Penicillins   . Cortisone Other (See Comments)    "just cant tolerate it"     Objective: Physical Exam  General: Well developed, nourished, no acute distress, awake, alert and oriented x 3  Vascular: Dorsalis pedis artery 0/4 bilateral, Posterior tibial artery 1/4 faint bilateral, skin temperature warm to warm proximal to distal bilateral lower extremities, + varicosities,  Trace edema bilateral ankles, Decreased pedal hair present bilateral.  Neurological: Gross sensation present via light touch bilateral.  Subjective numbness to both feet.  Dermatological: Skin is warm, dry, and supple bilateral, Nails 1-10 are tender, long, thick, and discolored with moderate subungal debris, no webspace macerations present bilateral, no open lesions present bilateral, no callus/corns/hyperkeratotic tissue present bilateral. No signs of infection bilateral.  Musculoskeletal:  Hammertoe and bunion deformities noted bilateral. Muscular strength within normal limits without pain on range of motion. No pain with calf compression bilateral.  Assessment and Plan:  Problem List Items Addressed This Visit    None    Visit Diagnoses    Dermatophytosis of nail    -  Primary   PVD (peripheral vascular disease) (HCC)       Anticoagulant long-term use         -Examined patient.  -Discussed treatment options for painful mycotic nails. -Mechanically debrided and reduced mycotic nails with sterile nail nipper and dremel nail file without incident.  -Recommended continue with elevation and compression garments to assist with edema control and recommendation by PCP -  Patient to return in 3 months/as needed for follow up evaluation or sooner if symptoms worsen.  Landis Martins, DPM

## 2018-02-20 DIAGNOSIS — M25512 Pain in left shoulder: Secondary | ICD-10-CM | POA: Diagnosis not present

## 2018-02-20 DIAGNOSIS — M25552 Pain in left hip: Secondary | ICD-10-CM | POA: Diagnosis not present

## 2018-03-21 DIAGNOSIS — I4821 Permanent atrial fibrillation: Secondary | ICD-10-CM | POA: Diagnosis not present

## 2018-03-21 DIAGNOSIS — Z6820 Body mass index (BMI) 20.0-20.9, adult: Secondary | ICD-10-CM | POA: Diagnosis not present

## 2018-03-21 DIAGNOSIS — Z7901 Long term (current) use of anticoagulants: Secondary | ICD-10-CM | POA: Diagnosis not present

## 2018-03-21 DIAGNOSIS — R2681 Unsteadiness on feet: Secondary | ICD-10-CM | POA: Diagnosis not present

## 2018-03-21 DIAGNOSIS — L02821 Furuncle of head [any part, except face]: Secondary | ICD-10-CM | POA: Diagnosis not present

## 2018-03-21 DIAGNOSIS — E039 Hypothyroidism, unspecified: Secondary | ICD-10-CM | POA: Diagnosis not present

## 2018-03-21 DIAGNOSIS — I10 Essential (primary) hypertension: Secondary | ICD-10-CM | POA: Diagnosis not present

## 2018-03-21 DIAGNOSIS — Z1389 Encounter for screening for other disorder: Secondary | ICD-10-CM | POA: Diagnosis not present

## 2018-04-02 DIAGNOSIS — L720 Epidermal cyst: Secondary | ICD-10-CM | POA: Diagnosis not present

## 2018-04-05 ENCOUNTER — Encounter: Payer: Self-pay | Admitting: Cardiology

## 2018-04-05 ENCOUNTER — Encounter: Payer: Self-pay | Admitting: *Deleted

## 2018-04-05 ENCOUNTER — Ambulatory Visit (INDEPENDENT_AMBULATORY_CARE_PROVIDER_SITE_OTHER): Payer: Medicare Other | Admitting: Cardiology

## 2018-04-05 VITALS — BP 120/62 | HR 87 | Ht 66.0 in | Wt 126.8 lb

## 2018-04-05 DIAGNOSIS — I482 Chronic atrial fibrillation, unspecified: Secondary | ICD-10-CM | POA: Diagnosis not present

## 2018-04-05 DIAGNOSIS — I119 Hypertensive heart disease without heart failure: Secondary | ICD-10-CM | POA: Diagnosis not present

## 2018-04-05 DIAGNOSIS — Z7901 Long term (current) use of anticoagulants: Secondary | ICD-10-CM | POA: Diagnosis not present

## 2018-04-05 DIAGNOSIS — I209 Angina pectoris, unspecified: Secondary | ICD-10-CM

## 2018-04-05 DIAGNOSIS — R079 Chest pain, unspecified: Secondary | ICD-10-CM

## 2018-04-05 DIAGNOSIS — R55 Syncope and collapse: Secondary | ICD-10-CM

## 2018-04-05 NOTE — Progress Notes (Signed)
Cardiology Office Note:    Date:  04/05/2018   ID:  Amber Cline, DOB 1926/07/16, MRN [id number]  PCP:  Eloisa Northern, MD  Cardiologist:  Norman Herrlich, MD    Referring MD: Eloisa Northern, MD    ASSESSMENT:    1. Near syncope   2. Angina pectoris (HCC)   3. Chronic atrial fibrillation   4. Long term current use of anticoagulant   5. Hypertensive heart disease without heart failure    PLAN:    In order of problems listed above:  1. I suspect she is having intermittent severe bradycardia with her sick sinus syndrome and atrial fibrillation.  Will utilize a 2-week ambulatory external loop recorder if unrevealing I think she needs an implanted loop recorder with the profound nature of her recent episode of near syncope.  Unfortunately she is also been admitted to the hospital with rapid uncontrolled atrial fibrillation and needs to remain on her current suppressant medications. 2. In the last month she has been having several times a week episodes of exertional chest pain as well as limiting shortness of breath relieved with nitroglycerin.  She has a history remote remote normal coronary arteriography and will undergo myocardial perfusion study and if abnormal require repeat coronary angiography and flow-limiting stenosis intervention. 3. Apparently stable and rate controlled however I suspect she is having significant bradycardia plan as above #1 continue her anticoagulant 4. Continue her current anticoagulant 5. Blood pressure stable continue current treatment including ARB calcium channel blocker and beta-blocker awaiting results of the above test   Next appointment: 6 weeks   Medication Adjustments/Labs and Tests Ordered: Current medicines are reviewed at length with the patient today.  Concerns regarding medicines are outlined above.  No orders of the defined types were placed in this encounter.  No orders of the defined types were placed in this encounter.   Chief Complaint    Patient presents with  . Follow-up    History of Present Illness:    Amber Cline is a 85 y.o. female with a hx of  chronic atrial fibrillation, angina with normal coronary arteriography and hypertension  last seen 12/06/17. Compliance with diet, lifestyle and medications: Yes  Overall change before Christmas to events occurred 1 she had an episode of prolonged chest pain Christmas Eve relieved after she finally took nitroglycerin typical angina substernal pressure shortness of breath and since then has had several episodes a week in the evening when she is doing household activities relieved with rest and nitroglycerin this is a new frequent occurrence for her and symptoms are typical exertional.  She also has exertional shortness of breath that is new she has no history of heart failure.  She had a profound syncopal episode also in the bathroom and was diffusely bruised and she is quite anxious and concerned and actually thought she had died at home the day of the event.  We discussed the concerns here in the office she has a history of remote normal coronary angiography and will do a pharmacologic myocardial perfusion study to quickly screen for CAD and a 2-week ambulatory external loop recorder if unrevealing I think she should have an implanted loop recorder.  At this time we will continue her current medications and schedule Past Medical History:  Diagnosis Date  . Angina pectoris (HCC) 06/06/2015   Overview:  With normal coronary arteriography 2006  Overview:  With normal coronary arteriography 2006  . Chronic atrial fibrillation 06/06/2015   Overview:  CHADS2 vasc  score= 6  Overview:  CHADS2 vasc score= 6  . HCD (hypertensive cardiovascular disease) 06/06/2015  . Long term current use of anticoagulant 06/06/2015    Past Surgical History:  Procedure Laterality Date  . CHOLECYSTECTOMY    . HAND SURGERY    . LITHOTRIPSY      Current Medications: Current Meds  Medication Sig  .  apixaban (ELIQUIS) 2.5 MG TABS tablet Take 1 tablet (2.5 mg total) by mouth 2 (two) times daily.  Marland Kitchen. Apoaequorin (PREVAGEN PO) Take 1 tablet by mouth daily.   . candesartan (ATACAND) 32 MG tablet Take 32 mg by mouth daily.   . clindamycin (CLEOCIN) 150 MG capsule TAKE ALL 4 CAPSULES 1 HOUR BEFORE ANY DENTAL APPOINTMENT  . Coenzyme Q10 (CO Q 10 PO) Take by mouth daily.  Marland Kitchen. diltiazem (TIAZAC) 240 MG 24 hr capsule Take 1 capsule (240 mg total) by mouth daily.  Marland Kitchen. levothyroxine (SYNTHROID) 125 MCG tablet Take 125 mcg by mouth daily.  . metoprolol tartrate (LOPRESSOR) 25 MG tablet TAKE 1/2 TABLET (12.5 MG) BY MOUTH TWICE DAILY (Patient taking differently: TAKE 25 mg in the morning and 12.5 mg at night)  . nitroGLYCERIN (NITROSTAT) 0.4 MG SL tablet Place 0.4 mg under the tongue every 5 (five) minutes x 3 doses as needed for chest pain.  . Vitamin D, Ergocalciferol, (DRISDOL) 50000 units CAPS capsule Take 50,000 Units by mouth every 7 (seven) days.      Allergies:   Azithromycin; Peanut oil; Penicillins; and Cortisone   Social History   Socioeconomic History  . Marital status: Widowed    Spouse name: Not on file  . Number of children: Not on file  . Years of education: Not on file  . Highest education level: Not on file  Occupational History  . Not on file  Social Needs  . Financial resource strain: Not on file  . Food insecurity:    Worry: Not on file    Inability: Not on file  . Transportation needs:    Medical: Not on file    Non-medical: Not on file  Tobacco Use  . Smoking status: Never Smoker  . Smokeless tobacco: Never Used  Substance and Sexual Activity  . Alcohol use: No  . Drug use: No  . Sexual activity: Not on file  Lifestyle  . Physical activity:    Days per week: Not on file    Minutes per session: Not on file  . Stress: Not on file  Relationships  . Social connections:    Talks on phone: Not on file    Gets together: Not on file    Attends religious service: Not on  file    Active member of club or organization: Not on file    Attends meetings of clubs or organizations: Not on file    Relationship status: Not on file  Other Topics Concern  . Not on file  Social History Narrative  . Not on file     Family History: The patient's family history includes Deafness in her mother; Heart disease in her brother, father, and paternal grandfather. ROS:   Please see the history of present illness.    All other systems reviewed and are negative.  EKGs/Labs/Other Studies Reviewed:    The following studies were reviewed today:  EKG:  EKG ordered today.  The ekg ordered today atrial fibrillation controlled rate  Recent Labs: No results found for requested labs within last 8760 hours.  Recent Lipid Panel No results found  for: CHOL, TRIG, HDL, CHOLHDL, VLDL, LDLCALC, LDLDIRECT  Physical Exam:    VS:  BP 120/62   Pulse 87   Ht 5\' 6"  (1.676 m)   Wt 126 lb 12.8 oz (57.5 kg)   SpO2 98%   BMI 20.47 kg/m     Wt Readings from Last 3 Encounters:  04/05/18 126 lb 12.8 oz (57.5 kg)  12/06/17 130 lb 6.4 oz (59.1 kg)  05/30/17 136 lb 1.9 oz (61.7 kg)     GEN:  Well nourished, well developed in no acute distress HEENT: Normal NECK: No JVD; No carotid bruits LYMPHATICS: No lymphadenopathy CARDIAC: Irregular regular no murmur of aortic stenosis RRR, no murmurs, rubs, gallops RESPIRATORY:  Clear to auscultation without rales, wheezing or rhonchi  ABDOMEN: Soft, non-tender, non-distended MUSCULOSKELETAL:  No edema; No deformity  SKIN: Warm and dry NEUROLOGIC:  Alert and oriented x 3 PSYCHIATRIC:  Normal affect    Signed, Norman Herrlich, MD  04/05/2018 9:24 AM    [facility]

## 2018-04-05 NOTE — Patient Instructions (Signed)
Medication Instructions:  Your physician recommends that you continue on your current medications as directed. Please refer to the Current Medication list given to you today.  If you need a refill on your cardiac medications before your next appointment, please call your pharmacy.   Lab work: None  If you have labs (blood work) drawn today and your tests are completely normal, you will receive your results only by: Marland Kitchen MyChart Message (if you have MyChart) OR . A paper copy in the mail If you have any lab test that is abnormal or we need to change your treatment, we will call you to review the results.  Testing/Procedures: You had an EKG today.   Your physician has recommended that you wear a ZIO monitor. ZIO monitors are medical devices that record the heart's electrical activity. Doctors most often use these monitors to diagnose arrhythmias. Arrhythmias are problems with the speed or rhythm of the heartbeat. The monitor is a small, portable device. You can wear one while you do your normal daily activities. This is usually used to diagnose what is causing palpitations/syncope (passing out). Wear for 14 days.   Your physician has requested that you have a lexiscan myoview. For further information please visit [email or web address] Please follow instruction sheet, as given.  Follow-Up: At [facility], you and your health needs are our priority.  As part of our continuing mission to provide you with exceptional heart care, we have created designated Provider Care Teams.  These Care Teams include your primary Cardiologist (physician) and Advanced Practice Providers (APPs -  Physician Assistants and Nurse Practitioners) who all work together to provide you with the care you need, when you need it. You will need a follow up appointment in 6 weeks.       Regadenoson injection What is this medicine? REGADENOSON is used to test the heart for coronary artery disease. It is used in patients who  can not exercise for their stress test. This medicine may be used for other purposes; ask your health care provider or pharmacist if you have questions. COMMON BRAND NAME(S): Lexiscan What should I tell my health care provider before I take this medicine? They need to know if you have any of these conditions: -heart problems -lung or breathing disease, like asthma or COPD -an unusual or allergic reaction to regadenoson, other medicines, foods, dyes, or preservatives -pregnant or trying to get pregnant -breast-feeding How should I use this medicine? This medicine is for injection into a vein. It is given by a health care professional in a hospital or clinic setting. Talk to your pediatrician regarding the use of this medicine in children. Special care may be needed. Overdosage: If you think you have taken too much of this medicine contact a poison control center or emergency room at once. NOTE: This medicine is only for you. Do not share this medicine with others. What if I miss a dose? This does not apply. What may interact with this medicine? -caffeine -dipyridamole -guarana -theophylline This list may not describe all possible interactions. Give your health care provider a list of all the medicines, herbs, non-prescription drugs, or dietary supplements you use. Also tell them if you smoke, drink alcohol, or use illegal drugs. Some items may interact with your medicine. What should I watch for while using this medicine? Your condition will be monitored carefully while you are receiving this medicine. Do not take medicines, foods, or drinks with caffeine (like coffee, tea, or colas) for at least 12 hours  before your test. If you do not know if something contains caffeine, ask your health care professional. What side effects may I notice from receiving this medicine? Side effects that you should report to your doctor or health care professional as soon as possible: -allergic reactions like  skin rash, itching or hives, swelling of the face, lips, or tongue -breathing problems -chest pain, tightness or palpitations -severe headache Side effects that usually do not require medical attention (report to your doctor or health care professional if they continue or are bothersome): -flushing -headache -irritation or pain at site where injected -nausea, vomiting This list may not describe all possible side effects. Call your doctor for medical advice about side effects. You may report side effects to FDA at 1-800-FDA-1088. Where should I keep my medicine? This drug is given in a hospital or clinic and will not be stored at home. NOTE: This sheet is a summary. It may not cover all possible information. If you have questions about this medicine, talk to your doctor, pharmacist, or health care provider.  2019 Elsevier/Gold Standard (2007-11-04 15:08:13)      Cardiac Nuclear Scan A cardiac nuclear scan is a test that is done to check the flow of blood to your heart. It is done when you are resting and when you are exercising. The test looks for problems such as:  Not enough blood reaching a portion of the heart.  The heart muscle not working as it should. You may need this test if:  You have heart disease.  You have had lab results that are not normal.  You have had heart surgery or a balloon procedure to open up blocked arteries (angioplasty).  You have chest pain.  You have shortness of breath. In this test, a special dye (tracer) is put into your bloodstream. The tracer will travel to your heart. A camera will then take pictures of your heart to see how the tracer moves through your heart. This test is usually done at a hospital and takes 2-4 hours. Tell a doctor about:  Any allergies you have.  All medicines you are taking, including vitamins, herbs, eye drops, creams, and over-the-counter medicines.  Any problems you or family members have had with anesthetic  medicines.  Any blood disorders you have.  Any surgeries you have had.  Any medical conditions you have.  Whether you are pregnant or may be pregnant. What are the risks? Generally, this is a safe test. However, problems may occur, such as:  Serious chest pain and heart attack. This is only a risk if the stress portion of the test is done.  Rapid heartbeat.  A feeling of warmth in your chest. This feeling usually does not last long.  Allergic reaction to the tracer. What happens before the test?  Ask your doctor about changing or stopping your normal medicines. This is important.  Follow instructions from your doctor about what you cannot eat or drink.  Remove your jewelry on the day of the test. What happens during the test?  An IV tube will be inserted into one of your veins.  Your doctor will give you a small amount of tracer through the IV tube.  You will wait for 20-40 minutes while the tracer moves through your bloodstream.  Your heart will be monitored with an electrocardiogram (ECG).  You will lie down on an exam table.  Pictures of your heart will be taken for about 15-20 minutes.  You may also have a stress  test. For this test, one of these things may be done: ? You will be asked to exercise on a treadmill or a stationary bike. ? You will be given medicines that will make your heart work harder. This is done if you are unable to exercise.  When blood flow to your heart has peaked, a tracer will again be given through the IV tube.  After 20-40 minutes, you will get back on the exam table. More pictures will be taken of your heart.  Depending on the tracer that is used, more pictures may need to be taken 3-4 hours later.  Your IV tube will be removed when the test is over. The test may vary among doctors and hospitals. What happens after the test?  Ask your doctor: ? Whether you can return to your normal schedule, including diet, activities, and  medicines. ? Whether you should drink more fluids. This will help to remove the tracer from your body. Drink enough fluid to keep your pee (urine) pale yellow.  Ask your doctor, or the department that is doing the test: ? When will my results be ready? ? How will I get my results? Summary  A cardiac nuclear scan is a test that is done to check the flow of blood to your heart.  Tell your doctor whether you are pregnant or may be pregnant.  Before the test, ask your doctor about changing or stopping your normal medicines. This is important.  Ask your doctor whether you can return to your normal activities. You may be asked to drink more fluids. This information is not intended to replace advice given to you by your health care provider. Make sure you discuss any questions you have with your health care provider. Document Released: 08/20/2017 Document Revised: 08/20/2017 Document Reviewed: 08/20/2017 Elsevier Interactive Patient Education  2019 [employer]

## 2018-04-21 ENCOUNTER — Other Ambulatory Visit: Payer: Self-pay | Admitting: Cardiology

## 2018-04-24 ENCOUNTER — Telehealth: Payer: Self-pay | Admitting: *Deleted

## 2018-04-24 NOTE — Telephone Encounter (Signed)
Patient wanted to cancel both the MPI and monitor for 05/01/18. She states she thinks why she was feeling bad was due to one of her meds.Amber Cline, Amber Cline

## 2018-05-01 ENCOUNTER — Telehealth: Payer: Self-pay | Admitting: Cardiology

## 2018-05-01 NOTE — Telephone Encounter (Signed)
Has some questions about the nuclear test that his mom cancelled

## 2018-05-02 NOTE — Telephone Encounter (Signed)
Explained to patient's son, Amber Cline, per DPR the process of a lexiscan. He states that patient is concerned that "this test will cause a great deal of pain and that is why she cancelled it." Amber Cline felt better after our conversation and reports that he will talk to the patient and plans to follow up with Dr. Dulce Sellar as scheduled on 05/20/2018 and will hopefully reschedule the lexiscan and monitor appointment at that time.   Amber Cline explained that the patient had an episode of chest pain the night before last and she "took 4 tablets of nitroglycerin within a few hours." Amber Cline that patient should go to the emergency department to be evaluated if chest pain resurfaces and educated him that only 3 nitroglycerin tablets should be taken in a row. Amber Cline verbalized understanding. No further questions.

## 2018-05-08 ENCOUNTER — Ambulatory Visit (INDEPENDENT_AMBULATORY_CARE_PROVIDER_SITE_OTHER): Payer: Medicare Other | Admitting: Sports Medicine

## 2018-05-08 ENCOUNTER — Encounter: Payer: Self-pay | Admitting: Sports Medicine

## 2018-05-08 VITALS — BP 138/77 | HR 78 | Resp 16

## 2018-05-08 DIAGNOSIS — B351 Tinea unguium: Secondary | ICD-10-CM

## 2018-05-08 DIAGNOSIS — Z7901 Long term (current) use of anticoagulants: Secondary | ICD-10-CM

## 2018-05-08 DIAGNOSIS — M79674 Pain in right toe(s): Secondary | ICD-10-CM

## 2018-05-08 DIAGNOSIS — M79675 Pain in left toe(s): Secondary | ICD-10-CM

## 2018-05-08 DIAGNOSIS — I739 Peripheral vascular disease, unspecified: Secondary | ICD-10-CM | POA: Diagnosis not present

## 2018-05-08 NOTE — Progress Notes (Signed)
Patient ID: SARA GAUTREAUX, female   DOB: January 18, 1927, 85 y.o.   MRN: [id number] Subjective: Amber Cline is a 85 y.o. female patient seen today in office with complaint of painful thickened and elongated toenails; unable to trim. Patient denies any changes with medical history since last visit reports that she is still recovering with her heart issues and still on blood thinner.  No other pedal complaints at this time.  Patient Active Problem List   Diagnosis Date Noted  . Hypothyroidism (acquired) 12/06/2017  . Angina pectoris (HCC) 06/06/2015  . Long term current use of anticoagulant 06/06/2015  . Chronic atrial fibrillation 06/06/2015  . Hypertensive heart disease 06/06/2015    Current Outpatient Medications on File Prior to Visit  Medication Sig Dispense Refill  . Apoaequorin (PREVAGEN PO) Take 1 tablet by mouth daily.     . candesartan (ATACAND) 32 MG tablet Take 32 mg by mouth daily.     . clindamycin (CLEOCIN) 150 MG capsule TAKE ALL 4 CAPSULES 1 HOUR BEFORE ANY DENTAL APPOINTMENT  12  . Coenzyme Q10 (CO Q 10 PO) Take by mouth daily.    Marland Kitchen diltiazem (TIAZAC) 240 MG 24 hr capsule Take 1 capsule (240 mg total) by mouth daily. 90 capsule 2  . ELIQUIS 2.5 MG TABS tablet TAKE 1 TABLET BY MOUTH TWICE A DAY 180 tablet 2  . levothyroxine (SYNTHROID) 125 MCG tablet Take 125 mcg by mouth daily.    . metoprolol tartrate (LOPRESSOR) 25 MG tablet TAKE 1/2 TABLET (12.5 MG) BY MOUTH TWICE DAILY (Patient taking differently: TAKE 25 mg in the morning and 12.5 mg at night) 30 tablet 11  . nitroGLYCERIN (NITROSTAT) 0.4 MG SL tablet Place 0.4 mg under the tongue every 5 (five) minutes x 3 doses as needed for chest pain.    . Vitamin D, Ergocalciferol, (DRISDOL) 50000 units CAPS capsule Take 50,000 Units by mouth every 7 (seven) days.      No current facility-administered medications on file prior to visit.     Allergies  Allergen Reactions  . Azithromycin Other (See Comments)    "flu like  symptoms"  . Peanut Oil   . Penicillins   . Cortisone Other (See Comments)    "just cant tolerate it"    Objective: Physical Exam  General: Well developed, nourished, no acute distress, awake, alert and oriented x 3  Vascular: Dorsalis pedis artery 0/4 bilateral, Posterior tibial artery 1/4 faint bilateral, skin temperature warm to warm proximal to distal bilateral lower extremities, + varicosities,  Trace edema bilateral ankles, Decreased pedal hair present bilateral.  Neurological: Gross sensation present via light touch bilateral.  Subjective numbness to both feet.  Dermatological: Skin is warm, dry, and supple bilateral, Nails 1-10 are tender, long, thick, and discolored with moderate subungal debris, no webspace macerations present bilateral, no open lesions present bilateral, no callus/corns/hyperkeratotic tissue present bilateral. No signs of infection bilateral.  Musculoskeletal:  Hammertoe and bunion deformities noted bilateral. Muscular strength within normal limits without pain on range of motion. No pain with calf compression bilateral.  Assessment and Plan:  Problem List Items Addressed This Visit    None    Visit Diagnoses    Pain due to onychomycosis of toenails of both feet    -  Primary   PVD (peripheral vascular disease) (HCC)       Anticoagulant long-term use         -Examined patient.  -Discussed treatment options for painful mycotic nails. -Mechanically debrided and  reduced mycotic nails with sterile nail nipper and dremel nail file without incident.  -Continue with elevation to assist with edema control -Patient to return in 3 months/as needed for follow up evaluation or sooner if symptoms worsen.  [name], DPM

## 2018-05-19 NOTE — Progress Notes (Signed)
Cardiology Office Note:    Date:  05/20/2018   ID:  Amber Cline, DOB 02-Dec-1926, MRN [id number]  PCP:  Eloisa Northern, MD  Cardiologist:  Norman Herrlich, MD    Referring MD: Eloisa Northern, MD    ASSESSMENT:    1. syncope   2. Hypertensive heart disease without heart failure   3. Chronic atrial fibrillation   4. Long term current use of anticoagulant   5. Angina pectoris (HCC)    PLAN:    In order of problems listed above:  1. No recurrence on concerned that she has erratic heart rate response atrial fibrillation is at risk for syncope with more aggressive rate suppression of asked her to wear an ambulatory heart rhythm monitor to assess her heart rate response. 2. Stable continue current treatment 3. Her rate appears to continue to be rapid with 2 drug suppression and syncope await results of her event monitor 4. Continue her anticoagulant 5. No recurrence on current medical treatment able New [place] Heart Association class I.   Next appointment: 3 months   Medication Adjustments/Labs and Tests Ordered: Current medicines are reviewed at length with the patient today.  Concerns regarding medicines are outlined above.  No orders of the defined types were placed in this encounter.  No orders of the defined types were placed in this encounter.   Chief Complaint  Patient presents with  . Follow-up  . Loss of Consciousness  . Chest Pain    History of Present Illness:    Amber Cline is a 85 y.o. female with a hx of  chronic atrial fibrillation, angina with normal coronary arteriography and hypertension last seen 03/26/18 after a syncope event.  I asked her to have a myocardial perfusion study and a ambulatory heart rhythm monitor neither of which have been performed Compliance with diet, lifestyle and medications: Yes  Unfortunately last visit was on so she called and canceled her chest overall she just does not feel well she continues to have episodes where her heart races  she feels short of breath but has not needed to use nitroglycerin.  She is supervised by her son he says she takes her medications and they are considering buying an apple watch so they can record her heart rhythm.  Today she agrees to undergo the testing was scheduled last visit and she will be set up for pharmacologic perfusion study and a 7-day ambulatory ZIO monitor.  No edema orthopnea syncope and again she has not had angina she just does not feel well. Past Medical History:  Diagnosis Date  . Angina pectoris (HCC) 06/06/2015   Overview:  With normal coronary arteriography 2006  Overview:  With normal coronary arteriography 2006  . Chronic atrial fibrillation 06/06/2015   Overview:  CHADS2 vasc score= 6  Overview:  CHADS2 vasc score= 6  . HCD (hypertensive cardiovascular disease) 06/06/2015  . Long term current use of anticoagulant 06/06/2015    Past Surgical History:  Procedure Laterality Date  . CHOLECYSTECTOMY    . HAND SURGERY    . LITHOTRIPSY      Current Medications: Current Meds  Medication Sig  . Apoaequorin (PREVAGEN PO) Take 1 tablet by mouth daily.   . candesartan (ATACAND) 32 MG tablet Take 32 mg by mouth daily.   . clindamycin (CLEOCIN) 150 MG capsule TAKE ALL 4 CAPSULES 1 HOUR BEFORE ANY DENTAL APPOINTMENT  . Coenzyme Q10 (CO Q 10 PO) Take by mouth daily.  Marland Kitchen diltiazem (TIAZAC) 240 MG 24  hr capsule Take 1 capsule (240 mg total) by mouth daily.  Marland Kitchen ELIQUIS 2.5 MG TABS tablet TAKE 1 TABLET BY MOUTH TWICE A DAY  . levothyroxine (SYNTHROID) 125 MCG tablet Take 125 mcg by mouth daily.  . metoprolol succinate (TOPROL-XL) 25 MG 24 hr tablet Take 25 mg by mouth as directed. Take 1 tablet in the morning and 12.5 mg in the evening  . nitroGLYCERIN (NITROSTAT) 0.4 MG SL tablet Place 0.4 mg under the tongue every 5 (five) minutes x 3 doses as needed for chest pain.  . Vitamin D, Ergocalciferol, (DRISDOL) 50000 units CAPS capsule Take 50,000 Units by mouth every 7 (seven) days.       Allergies:   Azithromycin; Peanut oil; Penicillins; and Cortisone   Social History   Socioeconomic History  . Marital status: Widowed    Spouse name: Not on file  . Number of children: Not on file  . Years of education: Not on file  . Highest education level: Not on file  Occupational History  . Not on file  Social Needs  . Financial resource strain: Not on file  . Food insecurity:    Worry: Not on file    Inability: Not on file  . Transportation needs:    Medical: Not on file    Non-medical: Not on file  Tobacco Use  . Smoking status: Never Smoker  . Smokeless tobacco: Never Used  Substance and Sexual Activity  . Alcohol use: No  . Drug use: No  . Sexual activity: Not on file  Lifestyle  . Physical activity:    Days per week: Not on file    Minutes per session: Not on file  . Stress: Not on file  Relationships  . Social connections:    Talks on phone: Not on file    Gets together: Not on file    Attends religious service: Not on file    Active member of club or organization: Not on file    Attends meetings of clubs or organizations: Not on file    Relationship status: Not on file  Other Topics Concern  . Not on file  Social History Narrative  . Not on file     Family History: The patient's family history includes Deafness in her mother; Heart disease in her brother, father, and paternal grandfather. ROS:   Please see the history of present illness.    All other systems reviewed and are negative.  EKGs/Labs/Other Studies Reviewed:    The following studies were reviewed today:  EKG:  EKG ordered today and personally reviewed.  The ekg ordered today demonstrates atrial fibrillation rate 98 bpm single PVCs nonspecific ST  Recent Labs: CBC in October was normal No results found for requested labs within last 8760 hours.  Recent Lipid Panel No results found for: CHOL, TRIG, HDL, CHOLHDL, VLDL, LDLCALC, LDLDIRECT  Physical Exam:    VS:  BP (!) 142/80 (BP  Location: Right Arm, Patient Position: Sitting, Cuff Size: Normal)   Pulse 98   Ht 5\' 6"  (1.676 m)   Wt 122 lb 6.4 oz (55.5 kg)   SpO2 96%   BMI 19.76 kg/m     Wt Readings from Last 3 Encounters:  05/20/18 122 lb 6.4 oz (55.5 kg)  04/05/18 126 lb 12.8 oz (57.5 kg)  12/06/17 130 lb 6.4 oz (59.1 kg)     GEN: She looks anxious well nourished, well developed in no acute distress HEENT: Normal NECK: No JVD; No carotid bruits  LYMPHATICS: No lymphadenopathy CARDIAC: Irregular rhythm variable first heart sound  no murmurs, rubs, gallops RESPIRATORY:  Clear to auscultation without rales, wheezing or rhonchi  ABDOMEN: Soft, non-tender, non-distended MUSCULOSKELETAL:  No edema; No deformity  SKIN: Warm and dry NEUROLOGIC:  Alert and oriented x 3 PSYCHIATRIC:  Normal affect    Signed, Norman Herrlich, MD  05/20/2018 9:30 AM    [facility]

## 2018-05-20 ENCOUNTER — Ambulatory Visit (INDEPENDENT_AMBULATORY_CARE_PROVIDER_SITE_OTHER): Payer: Medicare Other | Admitting: Cardiology

## 2018-05-20 ENCOUNTER — Encounter: Payer: Self-pay | Admitting: Cardiology

## 2018-05-20 VITALS — BP 142/80 | HR 98 | Ht 66.0 in | Wt 122.4 lb

## 2018-05-20 DIAGNOSIS — R55 Syncope and collapse: Secondary | ICD-10-CM | POA: Diagnosis not present

## 2018-05-20 DIAGNOSIS — I482 Chronic atrial fibrillation, unspecified: Secondary | ICD-10-CM

## 2018-05-20 DIAGNOSIS — I209 Angina pectoris, unspecified: Secondary | ICD-10-CM

## 2018-05-20 DIAGNOSIS — I119 Hypertensive heart disease without heart failure: Secondary | ICD-10-CM

## 2018-05-20 DIAGNOSIS — R079 Chest pain, unspecified: Secondary | ICD-10-CM | POA: Diagnosis not present

## 2018-05-20 DIAGNOSIS — Z7901 Long term (current) use of anticoagulants: Secondary | ICD-10-CM

## 2018-05-20 NOTE — Patient Instructions (Signed)
Medication Instructions:  Your physician recommends that you continue on your current medications as directed. Please refer to the Current Medication list given to you today.  If you need a refill on your cardiac medications before your next appointment, please call your pharmacy.   Lab work: None  If you have labs (blood work) drawn today and your tests are completely normal, you will receive your results only by: Marland Kitchen MyChart Message (if you have MyChart) OR . A paper copy in the mail If you have any lab test that is abnormal or we need to change your treatment, we will call you to review the results.  Testing/Procedures: You had an EKG today.   Your physician has requested that you have a lexiscan myoview. For further information please visit [email or web address] Please follow instruction sheet, as given.  Your physician has recommended that you wear a ZIO monitor. ZIO monitors are medical devices that record the heart's electrical activity. Doctors most often use these monitors to diagnose arrhythmias. Arrhythmias are problems with the speed or rhythm of the heartbeat. The monitor is a small, portable device. You can wear one while you do your normal daily activities. This is usually used to diagnose what is causing palpitations/syncope (passing out). Wear for 7 days.   Follow-Up: At [facility], you and your health needs are our priority.  As part of our continuing mission to provide you with exceptional heart care, we have created designated Provider Care Teams.  These Care Teams include your primary Cardiologist (physician) and Advanced Practice Providers (APPs -  Physician Assistants and Nurse Practitioners) who all work together to provide you with the care you need, when you need it. You will need a follow up appointment in 3 months.      Cardiac Nuclear Scan A cardiac nuclear scan is a test that is done to check the flow of blood to your heart. It is done when you are resting  and when you are exercising. The test looks for problems such as:  Not enough blood reaching a portion of the heart.  The heart muscle not working as it should. You may need this test if:  You have heart disease.  You have had lab results that are not normal.  You have had heart surgery or a balloon procedure to open up blocked arteries (angioplasty).  You have chest pain.  You have shortness of breath. In this test, a special dye (tracer) is put into your bloodstream. The tracer will travel to your heart. A camera will then take pictures of your heart to see how the tracer moves through your heart. This test is usually done at a hospital and takes 2-4 hours. Tell a doctor about:  Any allergies you have.  All medicines you are taking, including vitamins, herbs, eye drops, creams, and over-the-counter medicines.  Any problems you or family members have had with anesthetic medicines.  Any blood disorders you have.  Any surgeries you have had.  Any medical conditions you have.  Whether you are pregnant or may be pregnant. What are the risks? Generally, this is a safe test. However, problems may occur, such as:  Serious chest pain and heart attack. This is only a risk if the stress portion of the test is done.  Rapid heartbeat.  A feeling of warmth in your chest. This feeling usually does not last long.  Allergic reaction to the tracer. What happens before the test?  Ask your doctor about changing or stopping your  normal medicines. This is important.  Follow instructions from your doctor about what you cannot eat or drink.  Remove your jewelry on the day of the test. What happens during the test?  An IV tube will be inserted into one of your veins.  Your doctor will give you a small amount of tracer through the IV tube.  You will wait for 20-40 minutes while the tracer moves through your bloodstream.  Your heart will be monitored with an electrocardiogram  (ECG).  You will lie down on an exam table.  Pictures of your heart will be taken for about 15-20 minutes.  You may also have a stress test. For this test, one of these things may be done: ? You will be asked to exercise on a treadmill or a stationary bike. ? You will be given medicines that will make your heart work harder. This is done if you are unable to exercise.  When blood flow to your heart has peaked, a tracer will again be given through the IV tube.  After 20-40 minutes, you will get back on the exam table. More pictures will be taken of your heart.  Depending on the tracer that is used, more pictures may need to be taken 3-4 hours later.  Your IV tube will be removed when the test is over. The test may vary among doctors and hospitals. What happens after the test?  Ask your doctor: ? Whether you can return to your normal schedule, including diet, activities, and medicines. ? Whether you should drink more fluids. This will help to remove the tracer from your body. Drink enough fluid to keep your pee (urine) pale yellow.  Ask your doctor, or the department that is doing the test: ? When will my results be ready? ? How will I get my results? Summary  A cardiac nuclear scan is a test that is done to check the flow of blood to your heart.  Tell your doctor whether you are pregnant or may be pregnant.  Before the test, ask your doctor about changing or stopping your normal medicines. This is important.  Ask your doctor whether you can return to your normal activities. You may be asked to drink more fluids. This information is not intended to replace advice given to you by your health care provider. Make sure you discuss any questions you have with your health care provider. Document Released: 08/20/2017 Document Revised: 08/20/2017 Document Reviewed: 08/20/2017 Elsevier Interactive Patient Education  2019 Elsevier Inc.     Regadenoson injection What is this  medicine? REGADENOSON is used to test the heart for coronary artery disease. It is used in patients who can not exercise for their stress test. This medicine may be used for other purposes; ask your health care provider or pharmacist if you have questions. COMMON BRAND NAME(S): Lexiscan What should I tell my health care provider before I take this medicine? They need to know if you have any of these conditions: -heart problems -lung or breathing disease, like asthma or COPD -an unusual or allergic reaction to regadenoson, other medicines, foods, dyes, or preservatives -pregnant or trying to get pregnant -breast-feeding How should I use this medicine? This medicine is for injection into a vein. It is given by a health care professional in a hospital or clinic setting. Talk to your pediatrician regarding the use of this medicine in children. Special care may be needed. Overdosage: If you think you have taken too much of this medicine contact a  poison control center or emergency room at once. NOTE: This medicine is only for you. Do not share this medicine with others. What if I miss a dose? This does not apply. What may interact with this medicine? -caffeine -dipyridamole -guarana -theophylline This list may not describe all possible interactions. Give your health care provider a list of all the medicines, herbs, non-prescription drugs, or dietary supplements you use. Also tell them if you smoke, drink alcohol, or use illegal drugs. Some items may interact with your medicine. What should I watch for while using this medicine? Your condition will be monitored carefully while you are receiving this medicine. Do not take medicines, foods, or drinks with caffeine (like coffee, tea, or colas) for at least 12 hours before your test. If you do not know if something contains caffeine, ask your health care professional. What side effects may I notice from receiving this medicine? Side effects that  you should report to your doctor or health care professional as soon as possible: -allergic reactions like skin rash, itching or hives, swelling of the face, lips, or tongue -breathing problems -chest pain, tightness or palpitations -severe headache Side effects that usually do not require medical attention (report to your doctor or health care professional if they continue or are bothersome): -flushing -headache -irritation or pain at site where injected -nausea, vomiting This list may not describe all possible side effects. Call your doctor for medical advice about side effects. You may report side effects to FDA at 1-800-FDA-1088. Where should I keep my medicine? This drug is given in a hospital or clinic and will not be stored at home. NOTE: This sheet is a summary. It may not cover all possible information. If you have questions about this medicine, talk to your doctor, pharmacist, or health care provider.  2019 Elsevier/Gold Standard (2007-11-04 15:08:13)

## 2018-06-03 DIAGNOSIS — J329 Chronic sinusitis, unspecified: Secondary | ICD-10-CM | POA: Diagnosis not present

## 2018-06-03 DIAGNOSIS — I10 Essential (primary) hypertension: Secondary | ICD-10-CM | POA: Diagnosis not present

## 2018-06-04 ENCOUNTER — Other Ambulatory Visit: Payer: Self-pay | Admitting: Cardiology

## 2018-06-13 ENCOUNTER — Telehealth: Payer: Self-pay | Admitting: *Deleted

## 2018-06-13 NOTE — Telephone Encounter (Signed)
Cancelled nuclear study.

## 2018-06-24 ENCOUNTER — Other Ambulatory Visit (INDEPENDENT_AMBULATORY_CARE_PROVIDER_SITE_OTHER): Payer: Medicare Other

## 2018-06-24 DIAGNOSIS — I482 Chronic atrial fibrillation, unspecified: Secondary | ICD-10-CM | POA: Diagnosis not present

## 2018-06-24 DIAGNOSIS — R55 Syncope and collapse: Secondary | ICD-10-CM | POA: Diagnosis not present

## 2018-06-24 DIAGNOSIS — I209 Angina pectoris, unspecified: Secondary | ICD-10-CM | POA: Diagnosis not present

## 2018-07-03 NOTE — Telephone Encounter (Signed)
1 day Lexiscan put in recall

## 2018-07-05 DIAGNOSIS — R55 Syncope and collapse: Secondary | ICD-10-CM | POA: Diagnosis not present

## 2018-07-09 ENCOUNTER — Telehealth: Payer: Self-pay | Admitting: *Deleted

## 2018-07-09 NOTE — Telephone Encounter (Signed)
-----   Message from Baldo Daub, MD sent at 07/08/2018  4:19 PM EDT ----- Normal or stable result  Her ZIO monitor looks good there are no episodes of slow rhythm and I do not think we need to consider a pacemaker.  She has a small number of PVCs which are associated with the triggered events.  I would continue her current heart medicines.

## 2018-07-09 NOTE — Telephone Encounter (Signed)
Patient informed of ZIO monitor results but she has a hard time hearing on the phone, so she will have her son call to discuss these results to make sure she understands. Advised patient to provide a phone number to reach him at, but she states that he does not have a cell phone and is not home at this time. Patient's son, Amber Cline, will call our office later today to ensure understanding of monitor results.

## 2018-08-07 ENCOUNTER — Ambulatory Visit: Payer: Medicare Other | Admitting: Sports Medicine

## 2018-08-19 NOTE — Progress Notes (Deleted)
{Choose 1 Note Type (Telehealth Visit or Telephone Visit):[phone]}   Date:  08/19/2018   ID:  Amber Cline, DOB 1926-05-05, MRN [id number]  {Patient Location:[phone]::"Home"} {Provider Location:[phone]::"Home"}  PCP:  Amber Northern, MD  Cardiologist:  No primary care provider on file. *** Electrophysiologist:  None   Evaluation Performed:  {Choose Visit Type:[id number]::"Follow-Up Visit"}  Chief Complaint:  ***  History of Present Illness:    Amber Cline is a 85 y.o. female with a hx of  chronic atrial fibrillation, angina with normal coronary arteriography and hypertension last seen 05/20/18 after a syncopal event.  Study Highlights  ZIO monitor was performed 7 days 23 hours beginning 06/22/2018 to assess syncope in the setting of atrial fibrillation. The rhythm is chronic atrial fibrillation 100% with minimum average and maximum heart rates of 39, 72 and 167 bpm. Ventricular ectopy is rare less than 1% PVCs. There is a single pause of 3.0 seconds noted there were 2 episodes of sustained bradycardia 40 to 60 bpm that occurred between 1;00 and 2:00 AM. There were frequent triggered events with PVCs.  There are no symptomatic events.  Conclusion atrial fibrillation with good heart rate control, there is no serious bradycardia noted.  Triggered events correlate with PVCs.    The patient {does/does not:200015} have symptoms concerning for COVID-19 infection (fever, chills, cough, or new shortness of breath).    Past Medical History:  Diagnosis Date  . Angina pectoris ([facility]) 06/06/2015   Overview:  With normal coronary arteriography 2006  Overview:  With normal coronary arteriography 2006  . Chronic atrial fibrillation 06/06/2015   Overview:  CHADS2 vasc score= 6  Overview:  CHADS2 vasc score= 6  . HCD (hypertensive cardiovascular disease) 06/06/2015  . Long term current use of anticoagulant 06/06/2015   Past Surgical History:  Procedure Laterality Date  .  CHOLECYSTECTOMY    . HAND SURGERY    . LITHOTRIPSY       No outpatient medications have been marked as taking for the 08/20/18 encounter (Appointment) with Baldo Daub, MD.     Allergies:   Azithromycin; Peanut oil; Penicillins; and Cortisone   Social History   Tobacco Use  . Smoking status: Never Smoker  . Smokeless tobacco: Never Used  Substance Use Topics  . Alcohol use: No  . Drug use: No     Family Hx: The patient's family history includes Deafness in her mother; Heart disease in her brother, father, and paternal grandfather.  ROS:   Please see the history of present illness.    *** All other systems reviewed and are negative.   Prior CV studies:   The following studies were reviewed today:  ***  Labs/Other Tests and Data Reviewed:    EKG:  {EKG/Telemetry Strips [phone]}  Recent Labs: No results found for requested labs within last 8760 hours.   Recent Lipid Panel No results found for: CHOL, TRIG, HDL, CHOLHDL, LDLCALC, LDLDIRECT  Wt Readings from Last 3 Encounters:  05/20/18 122 lb 6.4 oz (55.5 kg)  04/05/18 126 lb 12.8 oz (57.5 kg)  12/06/17 130 lb 6.4 oz (59.1 kg)     Objective:    Vital Signs:  There were no vitals taken for this visit.   {HeartCare Virtual Exam (Optional):[phone]::"VITAL SIGNS:  reviewed"}  ASSESSMENT & PLAN:    1. ***  COVID-19 Education: The signs and symptoms of COVID-19 were discussed with the patient and how to seek care for testing (follow up with PCP or arrange E-visit).  ***The importance  of social distancing was discussed today.  Time:   Today, I have spent *** minutes with the patient with telehealth technology discussing the above problems.     Medication Adjustments/Labs and Tests Ordered: Current medicines are reviewed at length with the patient today.  Concerns regarding medicines are outlined above.   Tests Ordered: No orders of the defined types were placed in this  encounter.   Medication Changes: No orders of the defined types were placed in this encounter.   Disposition:  Follow up {follow up:15908}  Signed, Norman HerrlichBrian , MD  08/19/2018 12:54 PM    [facility]

## 2018-08-20 ENCOUNTER — Telehealth: Payer: Medicare Other | Admitting: Cardiology

## 2018-09-11 ENCOUNTER — Ambulatory Visit: Payer: Medicare Other | Admitting: Sports Medicine

## 2018-10-06 NOTE — Progress Notes (Deleted)
Cardiology Office Note:    Date:  10/06/2018   ID:  Amber Cline, DOB Nov 19, 1926, MRN [id number]  PCP:  Garwin Brothers, MD  Cardiologist:  Shirlee More, MD    Referring MD: Garwin Brothers, MD    ASSESSMENT:    No diagnosis found. PLAN:    In order of problems listed above:  1. ***   Next appointment: ***   Medication Adjustments/Labs and Tests Ordered: Current medicines are reviewed at length with the patient today.  Concerns regarding medicines are outlined above.  No orders of the defined types were placed in this encounter.  No orders of the defined types were placed in this encounter.   No chief complaint on file.   History of Present Illness:    Amber Cline is a 85 y.o. female with a hx of chronic atrial fibrillation, angina with normal coronary arteriography and hypertension   last seen 05/20/2018.  Subsequently a 7-day ZIO monitor was performed to assess syncope showing chronic atrial fibrillation with a single pause of 3 seconds and 2 episodes of sustained bradycardia 40 to 60 bpm.  The rhythm throughout was atrial fibrillation.  She also had a rare ventricular premature contractions as triggered events. Compliance with diet, lifestyle and medications: *** Past Medical History:  Diagnosis Date  . Angina pectoris ([place]) 06/06/2015   Overview:  With normal coronary arteriography 2006  Overview:  With normal coronary arteriography 2006  . Chronic atrial fibrillation 06/06/2015   Overview:  CHADS2 vasc score= 6  Overview:  CHADS2 vasc score= 6  . HCD (hypertensive cardiovascular disease) 06/06/2015  . Long term current use of anticoagulant 06/06/2015    Past Surgical History:  Procedure Laterality Date  . CHOLECYSTECTOMY    . HAND SURGERY    . LITHOTRIPSY      Current Medications: No outpatient medications have been marked as taking for the 10/07/18 encounter (Appointment) with Richardo Priest, MD.     Allergies:   Azithromycin, Peanut oil, Penicillins, and  Cortisone   Social History   Socioeconomic History  . Marital status: Widowed    Spouse name: Not on file  . Number of children: Not on file  . Years of education: Not on file  . Highest education level: Not on file  Occupational History  . Not on file  Social Needs  . Financial resource strain: Not on file  . Food insecurity    Worry: Not on file    Inability: Not on file  . Transportation needs    Medical: Not on file    Non-medical: Not on file  Tobacco Use  . Smoking status: Never Smoker  . Smokeless tobacco: Never Used  Substance and Sexual Activity  . Alcohol use: No  . Drug use: No  . Sexual activity: Not on file  Lifestyle  . Physical activity    Days per week: Not on file    Minutes per session: Not on file  . Stress: Not on file  Relationships  . Social Herbalist on phone: Not on file    Gets together: Not on file    Attends religious service: Not on file    Active member of club or organization: Not on file    Attends meetings of clubs or organizations: Not on file    Relationship status: Not on file  Other Topics Concern  . Not on file  Social History Narrative  . Not on file     Family  History: The patient's ***family history includes Deafness in her mother; Heart disease in her brother, father, and paternal grandfather. ROS:   Please see the history of present illness.    All other systems reviewed and are negative.  EKGs/Labs/Other Studies Reviewed:    The following studies were reviewed today:  EKG:  EKG ordered today and personally reviewed.  The ekg ordered today demonstrates ***  Recent Labs: No results found for requested labs within last 8760 hours.  Recent Lipid Panel No results found for: CHOL, TRIG, HDL, CHOLHDL, VLDL, LDLCALC, LDLDIRECT  Physical Exam:    VS:  There were no vitals taken for this visit.    Wt Readings from Last 3 Encounters:  05/20/18 122 lb 6.4 oz (55.5 kg)  04/05/18 126 lb 12.8 oz (57.5 kg)   12/06/17 130 lb 6.4 oz (59.1 kg)     GEN: *** Well nourished, well developed in no acute distress HEENT: Normal NECK: No JVD; No carotid bruits LYMPHATICS: No lymphadenopathy CARDIAC: ***RRR, no murmurs, rubs, gallops RESPIRATORY:  Clear to auscultation without rales, wheezing or rhonchi  ABDOMEN: Soft, non-tender, non-distended MUSCULOSKELETAL:  No edema; No deformity  SKIN: Warm and dry NEUROLOGIC:  Alert and oriented x 3 PSYCHIATRIC:  Normal affect    Signed, Norman HerrlichBrian Emaley Applin, MD  10/06/2018 9:32 PM    [facility]

## 2018-10-07 ENCOUNTER — Telehealth: Payer: Medicare Other | Admitting: Cardiology

## 2018-10-16 ENCOUNTER — Ambulatory Visit (INDEPENDENT_AMBULATORY_CARE_PROVIDER_SITE_OTHER): Payer: Medicare Other | Admitting: Sports Medicine

## 2018-10-16 ENCOUNTER — Other Ambulatory Visit: Payer: Self-pay

## 2018-10-16 ENCOUNTER — Encounter: Payer: Self-pay | Admitting: Sports Medicine

## 2018-10-16 VITALS — Temp 99.5°F | Resp 16

## 2018-10-16 DIAGNOSIS — M79674 Pain in right toe(s): Secondary | ICD-10-CM | POA: Diagnosis not present

## 2018-10-16 DIAGNOSIS — M79675 Pain in left toe(s): Secondary | ICD-10-CM

## 2018-10-16 DIAGNOSIS — I739 Peripheral vascular disease, unspecified: Secondary | ICD-10-CM | POA: Diagnosis not present

## 2018-10-16 DIAGNOSIS — Z7901 Long term (current) use of anticoagulants: Secondary | ICD-10-CM | POA: Diagnosis not present

## 2018-10-16 DIAGNOSIS — B351 Tinea unguium: Secondary | ICD-10-CM | POA: Diagnosis not present

## 2018-10-16 NOTE — Progress Notes (Signed)
Patient ID: Amber Cline, female   DOB: 1926/05/25, 85 y.o.   MRN: [id number] Subjective: Amber Cline is a 85 y.o. female patient seen today in office with complaint of painful thickened and elongated toenails; unable to trim. Patient denies any changes with medical history since last visit reports that she is still recovering with her heart issues and still on blood thinner but now has more swelling in ankles.  No other pedal complaints at this time.  Patient Active Problem List   Diagnosis Date Noted  . Hypothyroidism (acquired) 12/06/2017  . Angina pectoris ([place]) 06/06/2015  . Long term current use of anticoagulant 06/06/2015  . Chronic atrial fibrillation 06/06/2015  . Hypertensive heart disease 06/06/2015    Current Outpatient Medications on File Prior to Visit  Medication Sig Dispense Refill  . Apoaequorin (PREVAGEN PO) Take 1 tablet by mouth daily.     . candesartan (ATACAND) 32 MG tablet Take 32 mg by mouth daily.     . clindamycin (CLEOCIN) 150 MG capsule TAKE ALL 4 CAPSULES 1 HOUR BEFORE ANY DENTAL APPOINTMENT  12  . Coenzyme Q10 (CO Q 10 PO) Take by mouth daily.    Marland Kitchen diltiazem (TIAZAC) 240 MG 24 hr capsule TAKE 1 CAPSULE BY MOUTH EVERY DAY 90 capsule 2  . ELIQUIS 2.5 MG TABS tablet TAKE 1 TABLET BY MOUTH TWICE A DAY 180 tablet 2  . levothyroxine (SYNTHROID) 125 MCG tablet Take 125 mcg by mouth daily.    . metoprolol succinate (TOPROL-XL) 25 MG 24 hr tablet Take 25 mg by mouth as directed. Take 1 tablet in the morning and 12.5 mg in the evening    . nitroGLYCERIN (NITROSTAT) 0.4 MG SL tablet Place 0.4 mg under the tongue every 5 (five) minutes x 3 doses as needed for chest pain.    . Vitamin D, Ergocalciferol, (DRISDOL) 50000 units CAPS capsule Take 50,000 Units by mouth every 7 (seven) days.      No current facility-administered medications on file prior to visit.     Allergies  Allergen Reactions  . Azithromycin Other (See Comments)    "flu like symptoms"  .  Peanut Oil   . Penicillins   . Cortisone Other (See Comments)    "just cant tolerate it"    Objective: Physical Exam  General: Well developed, nourished, no acute distress, awake, alert and oriented x 3  Vascular: Dorsalis pedis artery 0/4 bilateral, Posterior tibial artery 1/4 faint bilateral, skin temperature warm to warm proximal to distal bilateral lower extremities, + varicosities,  Trace edema bilateral ankles, Decreased pedal hair present bilateral.  Neurological: Gross sensation present via light touch bilateral.  Subjective numbness to both feet.  Dermatological: Skin is warm, dry, and supple bilateral, Nails 1-10 are tender, long, thick, and discolored with moderate subungal debris, no webspace macerations present bilateral, no open lesions present bilateral, no callus/corns/hyperkeratotic tissue present bilateral. No signs of infection bilateral.  Musculoskeletal:  Hammertoe and bunion deformities noted bilateral. Muscular strength within normal limits without pain on range of motion. No pain with calf compression bilateral.  Assessment and Plan:  Problem List Items Addressed This Visit    None    Visit Diagnoses    Pain due to onychomycosis of toenails of both feet    -  Primary   PVD (peripheral vascular disease) ([place])       Anticoagulant long-term use         -Examined patient.  -Discussed treatment options for painful mycotic nails. -Mechanically  debrided and reduced mycotic nails with sterile nail nipper and dremel nail file without incident.  -Continue with elevation to assist with edema control and to further discuss with PCP -Patient to return in 3 months/as needed for follow up evaluation or sooner if symptoms worsen.  Asencion Islamitorya Zollie Ellery, DPM

## 2018-11-03 NOTE — Progress Notes (Signed)
Cardiology Office Note:    Date:  11/04/2018   ID:  Amber Cline, DOB [birth year], MRN [id number]  PCP:  Eloisa NorthernAmin, Saad, MD  Cardiologist:  Norman HerrlichBrian Raiden Haydu, MD    Referring MD: Eloisa NorthernAmin, Saad, MD    ASSESSMENT:    1. Hypertensive heart disease without heart failure   2. Chronic atrial fibrillation   3. Long term current use of anticoagulant   4. Angina pectoris (HCC)    PLAN:    In order of problems listed above:  1. Hypertensive heart disease without heart failure -stable at age 85 except his systolic blood pressure in the range of 115 continue beta-blocker and ARB 2. Chronic anticoagulation - Secondary to atrial fibrillation.  Continue same 3. Angina -stable no recurrence 4. Syncope -stable no recurrence 5. Atrial fibrillation rate controlled this time would not advise a pacemaker continue combined rate limiting calcium channel blocker beta-blocker and reduced dose anticoagulant 6. Chronic venous insufficiency we discussed venous hygiene including emollient elevation of legs thigh-high support hose and offered her reassurance.  She has marked varicosities mild dependent edema and intact pulses in both feet   Next appointment: 6 months   Medication Adjustments/Labs and Tests Ordered: Current medicines are reviewed at length with the patient today.  Concerns regarding medicines are outlined above.  No orders of the defined types were placed in this encounter.  No orders of the defined types were placed in this encounter.   No chief complaint on file.   History of Present Illness:    Amber Cline is a 85 y.o. female with a hx of chronic atrial fibrillation, angina with normal coronary arteriography, HTN, syncope last seen 05/19/08. She was asked to undergo myocardial perfusion and Zio for evaluation of chest pain and syncope. 7 day Zio 07/08/18 showed chronic atrial fibrillation with less than 1% PVCs which were triggered events, rate controlled with average rate of 72 - single  pause of 3 seconds.   Compliance with diet, lifestyle and medications: Yes  She has had 1 fall from walking up to put eyedrops in but has had no syncope or near syncope generally is doing better no shortness of breath palpitation chest pain but notices peripheral edema she has marked varicosities and chronic venous insufficiency.  She requests referral to ENT for chronic sinusitis. Past Medical History:  Diagnosis Date  . Angina pectoris (HCC) 06/06/2015   Overview:  With normal coronary arteriography 2006  Overview:  With normal coronary arteriography 2006  . Chronic atrial fibrillation 06/06/2015   Overview:  CHADS2 vasc score= 6  Overview:  CHADS2 vasc score= 6  . HCD (hypertensive cardiovascular disease) 06/06/2015  . Long term current use of anticoagulant 06/06/2015    Past Surgical History:  Procedure Laterality Date  . CHOLECYSTECTOMY    . HAND SURGERY    . LITHOTRIPSY      Current Medications: Current Meds  Medication Sig  . Apoaequorin (PREVAGEN PO) Take 1 tablet by mouth daily.   . candesartan (ATACAND) 32 MG tablet Take 32 mg by mouth daily.   . clindamycin (CLEOCIN) 150 MG capsule TAKE ALL 4 CAPSULES 1 HOUR BEFORE ANY DENTAL APPOINTMENT  . Coenzyme Q10 (CO Q 10 PO) Take 1 tablet by mouth daily.   Marland Kitchen. diltiazem (TIAZAC) 240 MG 24 hr capsule TAKE 1 CAPSULE BY MOUTH EVERY DAY  . ELIQUIS 2.5 MG TABS tablet TAKE 1 TABLET BY MOUTH TWICE A DAY  . levothyroxine (SYNTHROID) 125 MCG tablet Take 125 mcg by mouth  daily.  . metoprolol succinate (TOPROL-XL) 25 MG 24 hr tablet Take 25 mg by mouth as directed. Take 1 tablet in the morning and 12.5 mg in the evening  . Multiple Vitamins-Minerals (CENTRUM SILVER PO) Take 1 tablet by mouth daily.  . nitroGLYCERIN (NITROSTAT) 0.4 MG SL tablet Place 0.4 mg under the tongue every 5 (five) minutes x 3 doses as needed for chest pain.  . Probiotic Product (ALIGN PO) Take 1 tablet by mouth daily as needed.  . Vitamin D, Ergocalciferol, (DRISDOL)  50000 units CAPS capsule Take 50,000 Units by mouth every 7 (seven) days.      Allergies:   Azithromycin, Peanut oil, Penicillins, and Cortisone   Social History   Socioeconomic History  . Marital status: Widowed    Spouse name: Not on file  . Number of children: Not on file  . Years of education: Not on file  . Highest education level: Not on file  Occupational History  . Not on file  Social Needs  . Financial resource strain: Not on file  . Food insecurity    Worry: Not on file    Inability: Not on file  . Transportation needs    Medical: Not on file    Non-medical: Not on file  Tobacco Use  . Smoking status: Never Smoker  . Smokeless tobacco: Never Used  Substance and Sexual Activity  . Alcohol use: No  . Drug use: No  . Sexual activity: Not on file  Lifestyle  . Physical activity    Days per week: Not on file    Minutes per session: Not on file  . Stress: Not on file  Relationships  . Social Herbalist on phone: Not on file    Gets together: Not on file    Attends religious service: Not on file    Active member of club or organization: Not on file    Attends meetings of clubs or organizations: Not on file    Relationship status: Not on file  Other Topics Concern  . Not on file  Social History Narrative  . Not on file     Family History: The patient's family history includes Deafness in her mother; Heart disease in her brother, father, and paternal grandfather. ROS:   Please see the history of present illness.    All other systems reviewed and are negative.  EKGs/Labs/Other Studies Reviewed:    The following studies were reviewed today  Recent Labs: No results found for requested labs within last 8760 hours.  Recent Lipid Panel No results found for: CHOL, TRIG, HDL, CHOLHDL, VLDL, LDLCALC, LDLDIRECT  Physical Exam:    VS:  Wt 108 lb 12.8 oz (49.4 kg)   BMI 17.56 kg/m     Wt Readings from Last 3 Encounters:  11/04/18 108 lb 12.8 oz  (49.4 kg)  05/20/18 122 lb 6.4 oz (55.5 kg)  04/05/18 126 lb 12.8 oz (57.5 kg)     GEN: Frail well nourished, well developed in no acute distress HEENT: Normal NECK: No JVD; No carotid bruits LYMPHATICS: No lymphadenopathy CARDIAC: Irregular irregular variable first heart sound no murmurs, rubs, gallops RESPIRATORY:  Clear to auscultation without rales, wheezing or rhonchi  ABDOMEN: Soft, non-tender, non-distended MUSCULOSKELETAL:  No edema; No deformity  SKIN: Warm and dry NEUROLOGIC:  Alert and oriented x 3 PSYCHIATRIC:  Normal affect    Signed, Shirlee More, MD  11/04/2018 11:18 AM    [place]

## 2018-11-04 ENCOUNTER — Ambulatory Visit (INDEPENDENT_AMBULATORY_CARE_PROVIDER_SITE_OTHER): Payer: Medicare Other | Admitting: Cardiology

## 2018-11-04 ENCOUNTER — Encounter: Payer: Self-pay | Admitting: Cardiology

## 2018-11-04 ENCOUNTER — Other Ambulatory Visit: Payer: Self-pay

## 2018-11-04 VITALS — BP 154/78 | HR 81 | Ht 66.0 in | Wt 108.8 lb

## 2018-11-04 DIAGNOSIS — I119 Hypertensive heart disease without heart failure: Secondary | ICD-10-CM

## 2018-11-04 DIAGNOSIS — I482 Chronic atrial fibrillation, unspecified: Secondary | ICD-10-CM

## 2018-11-04 DIAGNOSIS — I209 Angina pectoris, unspecified: Secondary | ICD-10-CM | POA: Diagnosis not present

## 2018-11-04 DIAGNOSIS — J329 Chronic sinusitis, unspecified: Secondary | ICD-10-CM | POA: Diagnosis not present

## 2018-11-04 DIAGNOSIS — Z7901 Long term (current) use of anticoagulants: Secondary | ICD-10-CM | POA: Diagnosis not present

## 2018-11-04 NOTE — Patient Instructions (Signed)
Medication Instructions:  Your physician recommends that you continue on your current medications as directed. Please refer to the Current Medication list given to you today.  If you need a refill on your cardiac medications before your next appointment, please call your pharmacy.   Lab work: None  If you have labs (blood work) drawn today and your tests are completely normal, you will receive your results only by: Marland Kitchen MyChart Message (if you have MyChart) OR . A paper copy in the mail If you have any lab test that is abnormal or we need to change your treatment, we will call you to review the results.  Testing/Procedures: You have been referred to see Dr. Gaylyn Cheers with ENT due to chronic sinus infections. You will be contacted to schedule this appointment.   Follow-Up: At [facility], you and your health needs are our priority.  As part of our continuing mission to provide you with exceptional heart care, we have created designated Provider Care Teams.  These Care Teams include your primary Cardiologist (physician) and Advanced Practice Providers (APPs -  Physician Assistants and Nurse Practitioners) who all work together to provide you with the care you need, when you need it. . You will need a follow up appointment in 6 months.  Please call our office 2 months in advance to schedule this appointment.    Any Other Special Instructions Will Be Listed Below (If Applicable). **Wear knee high support hose daily!  **You can get eye protection at [employer]!

## 2018-12-10 DIAGNOSIS — Z23 Encounter for immunization: Secondary | ICD-10-CM | POA: Diagnosis not present

## 2019-01-14 ENCOUNTER — Other Ambulatory Visit: Payer: Self-pay | Admitting: Cardiology

## 2019-01-15 ENCOUNTER — Ambulatory Visit (INDEPENDENT_AMBULATORY_CARE_PROVIDER_SITE_OTHER): Payer: Medicare Other | Admitting: Sports Medicine

## 2019-01-15 ENCOUNTER — Other Ambulatory Visit: Payer: Self-pay

## 2019-01-15 ENCOUNTER — Encounter: Payer: Self-pay | Admitting: Sports Medicine

## 2019-01-15 DIAGNOSIS — M79674 Pain in right toe(s): Secondary | ICD-10-CM

## 2019-01-15 DIAGNOSIS — Z7901 Long term (current) use of anticoagulants: Secondary | ICD-10-CM

## 2019-01-15 DIAGNOSIS — M79675 Pain in left toe(s): Secondary | ICD-10-CM

## 2019-01-15 DIAGNOSIS — I739 Peripheral vascular disease, unspecified: Secondary | ICD-10-CM

## 2019-01-15 DIAGNOSIS — B351 Tinea unguium: Secondary | ICD-10-CM | POA: Diagnosis not present

## 2019-01-15 NOTE — Progress Notes (Signed)
Patient ID: Amber Cline, female   DOB: 08/02/1926, 85 y.o.   MRN: [id number] Subjective: Amber Cline is a 85 y.o. female patient seen today in office with complaint of painful thickened and elongated toenails; unable to trim. Patient denies any changes with medical history except swelling on her ankles like before her cardiologist recommended compression sleeves and also reports some pain to the tip of the right second toe, no other pedal complaints at this time.  Patient Active Problem List   Diagnosis Date Noted  . Hypothyroidism (acquired) 12/06/2017  . Angina pectoris (HCC) 06/06/2015  . Long term current use of anticoagulant 06/06/2015  . Chronic atrial fibrillation (HCC) 06/06/2015  . Hypertensive heart disease 06/06/2015    Current Outpatient Medications on File Prior to Visit  Medication Sig Dispense Refill  . Apoaequorin (PREVAGEN PO) Take 1 tablet by mouth daily.     . candesartan (ATACAND) 32 MG tablet Take 32 mg by mouth daily.     . clindamycin (CLEOCIN) 150 MG capsule TAKE ALL 4 CAPSULES 1 HOUR BEFORE ANY DENTAL APPOINTMENT  12  . Coenzyme Q10 (CO Q 10 PO) Take 1 tablet by mouth daily.     Marland Kitchen diltiazem (TIAZAC) 240 MG 24 hr capsule TAKE 1 CAPSULE BY MOUTH EVERY DAY 90 capsule 2  . ELIQUIS 2.5 MG TABS tablet TAKE 1 TABLET BY MOUTH TWICE A DAY 180 tablet 2  . levothyroxine (SYNTHROID) 125 MCG tablet Take 125 mcg by mouth daily.    . metoprolol succinate (TOPROL-XL) 25 MG 24 hr tablet Take 25 mg by mouth as directed. Take 1 tablet in the morning and 12.5 mg in the evening    . Multiple Vitamins-Minerals (CENTRUM SILVER PO) Take 1 tablet by mouth daily.    . nitroGLYCERIN (NITROSTAT) 0.4 MG SL tablet Place 0.4 mg under the tongue every 5 (five) minutes x 3 doses as needed for chest pain.    . Probiotic Product (ALIGN PO) Take 1 tablet by mouth daily as needed.    . Vitamin D, Ergocalciferol, (DRISDOL) 50000 units CAPS capsule Take 50,000 Units by mouth every 7 (seven)  days.      No current facility-administered medications on file prior to visit.     Allergies  Allergen Reactions  . Azithromycin Other (See Comments)    "flu like symptoms"  . Peanut Oil   . Penicillins   . Cortisone Other (See Comments)    "just cant tolerate it"    Objective: Physical Exam  General: Well developed, nourished, no acute distress, awake, alert and oriented x 3  Vascular: Dorsalis pedis artery 0/4 bilateral, Posterior tibial artery 1/4 faint bilateral, skin temperature warm to warm proximal to distal bilateral lower extremities, + varicosities,  Trace edema bilateral ankles, Decreased pedal hair present bilateral.  Neurological: Gross sensation present via light touch bilateral.  Subjective numbness to both feet.  Dermatological: Skin is warm, dry, and supple bilateral, Nails 1-10 are tender, long, thick, and discolored with moderate subungal debris, no webspace macerations present bilateral, no open lesions present bilateral, no callus/corns/hyperkeratotic tissue present bilateral. No signs of infection bilateral.  Musculoskeletal:  Hammertoe and bunion deformities noted bilateral. Muscular strength within normal limits without pain on range of motion. No pain with calf compression bilateral.  Assessment and Plan:  Problem List Items Addressed This Visit    None    Visit Diagnoses    Pain due to onychomycosis of toenails of both feet    -  Primary  PVD (peripheral vascular disease) (HCC)       Anticoagulant long-term use         -Examined patient.  -Discussed treatment options for painful mycotic nails. -Mechanically debrided and reduced mycotic nails with sterile nail nipper and dremel nail file without incident.  -Continue with elevation to assist with edema control and to continue with cardiology recommendation of compression stockings -Dispensed toe cap for patient to use when in shoes and right second toe as needed -Patient to return in 3 months/as  needed for follow up evaluation or sooner if symptoms worsen.  Landis Martins, DPM

## 2019-02-07 DIAGNOSIS — Z9109 Other allergy status, other than to drugs and biological substances: Secondary | ICD-10-CM | POA: Diagnosis not present

## 2019-02-07 DIAGNOSIS — J300 Vasomotor rhinitis: Secondary | ICD-10-CM | POA: Diagnosis not present

## 2019-02-07 DIAGNOSIS — J342 Deviated nasal septum: Secondary | ICD-10-CM | POA: Diagnosis not present

## 2019-02-07 DIAGNOSIS — Z87898 Personal history of other specified conditions: Secondary | ICD-10-CM | POA: Diagnosis not present

## 2019-02-16 ENCOUNTER — Other Ambulatory Visit: Payer: Self-pay | Admitting: Cardiology

## 2019-02-17 NOTE — Telephone Encounter (Signed)
Tiazac refill sent to [facility] in [place].

## 2019-02-23 ENCOUNTER — Other Ambulatory Visit: Payer: Self-pay | Admitting: Cardiology

## 2019-04-16 ENCOUNTER — Encounter: Payer: Self-pay | Admitting: Sports Medicine

## 2019-04-16 ENCOUNTER — Ambulatory Visit (INDEPENDENT_AMBULATORY_CARE_PROVIDER_SITE_OTHER): Payer: Medicare Other | Admitting: Sports Medicine

## 2019-04-16 ENCOUNTER — Other Ambulatory Visit: Payer: Self-pay

## 2019-04-16 DIAGNOSIS — I739 Peripheral vascular disease, unspecified: Secondary | ICD-10-CM | POA: Diagnosis not present

## 2019-04-16 DIAGNOSIS — M79675 Pain in left toe(s): Secondary | ICD-10-CM

## 2019-04-16 DIAGNOSIS — Z7901 Long term (current) use of anticoagulants: Secondary | ICD-10-CM

## 2019-04-16 DIAGNOSIS — M79674 Pain in right toe(s): Secondary | ICD-10-CM | POA: Diagnosis not present

## 2019-04-16 DIAGNOSIS — B351 Tinea unguium: Secondary | ICD-10-CM

## 2019-04-16 NOTE — Progress Notes (Signed)
Patient ID: Amber Cline, female   DOB: [birth year], 86 y.o.   MRN: [id number] Subjective: Amber Cline is a 86 y.o. female patient seen today in office with complaint of painful thickened and elongated toenails; unable to trim. Patient denies any changes with medical history except more swelling on ankles that is not getting better even with elevation and use of compression socks, reports she always has shortness or breath due to her heart issues, no other pedal complaints at this time.  Patient Active Problem List   Diagnosis Date Noted  . Hypothyroidism (acquired) 12/06/2017  . Angina pectoris ([place]) 06/06/2015  . Long term current use of anticoagulant 06/06/2015  . Chronic atrial fibrillation ([place]) 06/06/2015  . Hypertensive heart disease 06/06/2015    Current Outpatient Medications on File Prior to Visit  Medication Sig Dispense Refill  . Apoaequorin (PREVAGEN PO) Take 1 tablet by mouth daily.     . candesartan (ATACAND) 32 MG tablet TAKE 1 TABLET (32 MG TOTAL) BY MOUTH DAILY. 90 tablet 1  . clindamycin (CLEOCIN) 150 MG capsule TAKE ALL 4 CAPSULES 1 HOUR BEFORE ANY DENTAL APPOINTMENT  12  . Coenzyme Q10 (CO Q 10 PO) Take 1 tablet by mouth daily.     Marland Kitchen ELIQUIS 2.5 MG TABS tablet TAKE 1 TABLET BY MOUTH TWICE A DAY 180 tablet 2  . levothyroxine (SYNTHROID) 125 MCG tablet Take 125 mcg by mouth daily.    . metoprolol succinate (TOPROL-XL) 25 MG 24 hr tablet Take 25 mg by mouth as directed. Take 1 tablet in the morning and 12.5 mg in the evening    . Multiple Vitamins-Minerals (CENTRUM SILVER PO) Take 1 tablet by mouth daily.    . nitroGLYCERIN (NITROSTAT) 0.4 MG SL tablet Place 0.4 mg under the tongue every 5 (five) minutes x 3 doses as needed for chest pain.    . Probiotic Product (ALIGN PO) Take 1 tablet by mouth daily as needed.    Deborah Chalk ER 240 MG 24 hr capsule TAKE 1 CAPSULE BY MOUTH EVERY DAY 90 capsule 1  . Vitamin D, Ergocalciferol, (DRISDOL) 50000 units CAPS capsule Take  50,000 Units by mouth every 7 (seven) days.      No current facility-administered medications on file prior to visit.    Allergies  Allergen Reactions  . Azithromycin Other (See Comments)    "flu like symptoms"  . Peanut Oil   . Penicillins   . Cortisone Other (See Comments)    "just cant tolerate it"    Objective: Physical Exam  General: Well developed, nourished, no acute distress, awake, alert and oriented x 3  Vascular: Dorsalis pedis artery 0/4 bilateral, Posterior tibial artery 1/4 faint bilateral, skin temperature warm to warm proximal to distal bilateral lower extremities, + varicosities,  +1 pitting edema bilateral ankles, Decreased pedal hair present bilateral. No acute signs of DVT no pain warmth or redness to calf.   Neurological: Gross sensation present via light touch bilateral.  Subjective numbness to both feet.  Dermatological: Skin is warm, dry, and supple bilateral, Nails 1-10 are tender, long, thick, and discolored with moderate subungal debris, no webspace macerations present bilateral, no open lesions present bilateral, no callus/corns/hyperkeratotic tissue present bilateral. No signs of infection bilateral.  Musculoskeletal:  Hammertoe and bunion deformities noted bilateral. Muscular strength within normal limits without pain on range of motion. No pain with calf compression bilateral.  Assessment and Plan:  Problem List Items Addressed This Visit    None  Visit Diagnoses    Pain due to onychomycosis of toenails of both feet    -  Primary   PVD (peripheral vascular disease) (HCC)       Anticoagulant long-term use         -Examined patient.  -Discussed treatment options for painful mycotic nails. -Mechanically debrided and reduced mycotic nails with sterile nail nipper and dremel nail file without incident.  -Continue with elevation to assist with edema control and to continue with cardiology recommendation of compression stockings however at this time  due to increased edema recommend also discussing with PCP the possibility of adding on a diuretic  -Patient to return in 3 months/as needed for follow up evaluation or sooner if symptoms worsen.  [name], DPM

## 2019-04-29 DIAGNOSIS — I480 Paroxysmal atrial fibrillation: Secondary | ICD-10-CM | POA: Diagnosis not present

## 2019-04-29 DIAGNOSIS — E039 Hypothyroidism, unspecified: Secondary | ICD-10-CM | POA: Diagnosis not present

## 2019-04-29 DIAGNOSIS — Z7901 Long term (current) use of anticoagulants: Secondary | ICD-10-CM | POA: Diagnosis not present

## 2019-04-29 DIAGNOSIS — I10 Essential (primary) hypertension: Secondary | ICD-10-CM | POA: Diagnosis not present

## 2019-04-29 DIAGNOSIS — S0990XA Unspecified injury of head, initial encounter: Secondary | ICD-10-CM | POA: Diagnosis not present

## 2019-05-09 DIAGNOSIS — R319 Hematuria, unspecified: Secondary | ICD-10-CM | POA: Diagnosis not present

## 2019-05-28 DIAGNOSIS — I10 Essential (primary) hypertension: Secondary | ICD-10-CM | POA: Diagnosis not present

## 2019-05-28 DIAGNOSIS — M7989 Other specified soft tissue disorders: Secondary | ICD-10-CM | POA: Diagnosis not present

## 2019-05-28 DIAGNOSIS — R0602 Shortness of breath: Secondary | ICD-10-CM | POA: Diagnosis not present

## 2019-05-28 DIAGNOSIS — R2681 Unsteadiness on feet: Secondary | ICD-10-CM | POA: Diagnosis not present

## 2019-05-28 DIAGNOSIS — I480 Paroxysmal atrial fibrillation: Secondary | ICD-10-CM | POA: Diagnosis not present

## 2019-06-14 ENCOUNTER — Other Ambulatory Visit: Payer: Self-pay | Admitting: Cardiology

## 2019-06-20 ENCOUNTER — Other Ambulatory Visit: Payer: Self-pay

## 2019-06-22 NOTE — Progress Notes (Signed)
Cardiology Office Note:    Date:  06/23/2019   ID:  Amber Cline, DOB 1926/11/24, MRN [id number]  PCP:  Amber Northern, MD  Cardiologist:  Norman Herrlich, MD    Referring MD: Amber Northern, MD    ASSESSMENT:    1. Frequent falls   2. Chronic atrial fibrillation (HCC)   3. Long term current use of anticoagulant   4. Hypertensive heart disease without heart failure    PLAN:    In order of problems listed above:  1. Greatest risk to her health is frequent falls and I think the plan outlined by her PCP is good including a cane restricted activity supervision by her son and referral to physical therapy. 2. Stable she is not having syncope 3. Continue reduced dose anticoagulant 4. Blood pressure mildly elevated but at target for age continue current treatment with symptoms of heart failure edema shortness of breath I asked her to take a minimum dose of diuretic torsemide 10 mg every other day.   Next appointment: 3 months   Medication Adjustments/Labs and Tests Ordered: Current medicines are reviewed at length with the patient today.  Concerns regarding medicines are outlined above.  No orders of the defined types were placed in this encounter.  Meds ordered this encounter  Medications  . torsemide (DEMADEX) 20 MG tablet    Sig: Take 0.5 tablets (10 mg total) by mouth every other day.    Dispense:  22.5 tablet    Refill:  3    Chief Complaint  Patient presents with  . Follow-up  . Atrial Fibrillation  . Anticoagulation  . Hypertension    History of Present Illness:    Amber Cline is a 86 y.o. female with a hx of chronic atrial fibrillation, angina with normal coronary arteriography, HTN, and syncope last seen 11/04/2018.  Another event monitor showed no bradycardia in rate controlled atrial fibrillation.  She was noted to have chronic venous insufficiency with marked varicosities and mild dependent edema. Compliance with diet, lifestyle and medications: Yes  She is not  doing well she is increasingly frail unsteady falls at home seen by primary care physician she is pending a chest x-ray had labs done and needs to get her COVID-19 vaccination and engage in physical therapy at [facility].  She did not have syncope.  She has peripheral edema she takes a diuretic rarely every few months and will put her on a low-dose of torsemide 10 mg 3 days a week with her son supervising medications no orthopnea chest pain or syncope.  She has had no bleeding complication from her anticoagulant. Past Medical History:  Diagnosis Date  . Angina pectoris (HCC) 06/06/2015   Overview:  With normal coronary arteriography 2006  Overview:  With normal coronary arteriography 2006  . Chronic atrial fibrillation (HCC) 06/06/2015   Overview:  CHADS2 vasc score= 6  Overview:  CHADS2 vasc score= 6  . HCD (hypertensive cardiovascular disease) 06/06/2015  . Hypertensive heart disease 06/06/2015  . Hypothyroidism (acquired) 12/06/2017  . Long term current use of anticoagulant 06/06/2015    Past Surgical History:  Procedure Laterality Date  . CHOLECYSTECTOMY    . HAND SURGERY    . LITHOTRIPSY      Current Medications: Current Meds  Medication Sig  . Apoaequorin (PREVAGEN PO) Take 1 tablet by mouth daily.   . candesartan (ATACAND) 32 MG tablet TAKE 1 TABLET (32 MG TOTAL) BY MOUTH DAILY.  . clindamycin (CLEOCIN) 150 MG capsule TAKE ALL  4 CAPSULES 1 HOUR BEFORE ANY DENTAL APPOINTMENT  . Coenzyme Q10 (CO Q 10 PO) Take 1 tablet by mouth daily.   Marland Kitchen ELIQUIS 2.5 MG TABS tablet TAKE 1 TABLET BY MOUTH TWICE A DAY  . ipratropium (ATROVENT) 0.06 % nasal spray Place 2 sprays into both nostrils 2 (two) times daily as needed.  Marland Kitchen levothyroxine (SYNTHROID) 125 MCG tablet Take 125 mcg by mouth daily.  . metoprolol succinate (TOPROL-XL) 25 MG 24 hr tablet Take 25 mg by mouth as directed. Take 1 tablet in the morning and 12.5 mg in the evening  . metoprolol tartrate (LOPRESSOR) 25 MG tablet Take 25 mg by  mouth 2 (two) times daily.  . Multiple Vitamins-Minerals (CENTRUM SILVER PO) Take 1 tablet by mouth daily.  . nitroGLYCERIN (NITROSTAT) 0.4 MG SL tablet Place 0.4 mg under the tongue every 5 (five) minutes x 3 doses as needed for chest pain.  . Probiotic Product (ALIGN PO) Take 1 tablet by mouth daily as needed.  Chelsea Aus ER 240 MG 24 hr capsule TAKE 1 CAPSULE BY MOUTH EVERY DAY  . Vitamin D, Ergocalciferol, (DRISDOL) 50000 units CAPS capsule Take 50,000 Units by mouth every 7 (seven) days.   . [DISCONTINUED] torsemide (DEMADEX) 20 MG tablet Take 20 mg by mouth daily.     Allergies:   Azithromycin, Peanut oil, Penicillins, and Cortisone   Social History   Socioeconomic History  . Marital status: Widowed    Spouse name: Not on file  . Number of children: Not on file  . Years of education: Not on file  . Highest education level: Not on file  Occupational History  . Not on file  Tobacco Use  . Smoking status: Never Smoker  . Smokeless tobacco: Never Used  Substance and Sexual Activity  . Alcohol use: No  . Drug use: No  . Sexual activity: Not on file  Other Topics Concern  . Not on file  Social History Narrative  . Not on file   Social Determinants of Health   Financial Resource Strain:   . Difficulty of Paying Living Expenses:   Food Insecurity:   . Worried About Programme researcher, broadcasting/film/video in the Last Year:   . Barista in the Last Year:   Transportation Needs:   . Freight forwarder (Medical):   Marland Kitchen Lack of Transportation (Non-Medical):   Physical Activity:   . Days of Exercise per Week:   . Minutes of Exercise per Session:   Stress:   . Feeling of Stress :   Social Connections:   . Frequency of Communication with Friends and Family:   . Frequency of Social Gatherings with Friends and Family:   . Attends Religious Services:   . Active Member of Clubs or Organizations:   . Attends Banker Meetings:   Marland Kitchen Marital Status:      Family History: The  patient's family history includes Deafness in her mother; Heart disease in her brother, father, and paternal grandfather. ROS:   Please see the history of present illness.    All other systems reviewed and are negative.  EKGs/Labs/Other Studies Reviewed:    The following studies were reviewed today:    Recent Labs: 04/29/2019 creatinine 0.93 TSH normal  Physical Exam:    VS:  BP (!) 148/78   Pulse 80   Temp 97.7 F (36.5 C)   Ht 5\' 6"  (1.676 m)   Wt 105 lb 9.6 oz (47.9 kg)   SpO2  96%   BMI 17.04 kg/m     Wt Readings from Last 3 Encounters:  06/23/19 105 lb 9.6 oz (47.9 kg)  11/04/18 108 lb 12.8 oz (49.4 kg)  05/20/18 122 lb 6.4 oz (55.5 kg)     GEN: She looks increasingly frail poor muscle mass extensive bruising of her face well nourished, well developed in no acute distress HEENT: Normal NECK: No JVD; No carotid bruits LYMPHATICS: No lymphadenopathy CARDIAC: Irregular rhythm S1 variable no murmurs, rubs, gallops RESPIRATORY:  Clear to auscultation without rales, wheezing or rhonchi  ABDOMEN: Soft, non-tender, non-distended MUSCULOSKELETAL: Plus pitting edema to the knees bilaterally edema; No deformity  SKIN: Warm and dry NEUROLOGIC:  Alert and oriented x 3 PSYCHIATRIC:  Normal affect    Signed, Shirlee More, MD  06/23/2019 3:26 PM    [facility]

## 2019-06-23 ENCOUNTER — Ambulatory Visit (INDEPENDENT_AMBULATORY_CARE_PROVIDER_SITE_OTHER): Payer: Medicare Other | Admitting: Cardiology

## 2019-06-23 ENCOUNTER — Encounter: Payer: Self-pay | Admitting: Cardiology

## 2019-06-23 ENCOUNTER — Other Ambulatory Visit: Payer: Self-pay

## 2019-06-23 VITALS — BP 148/78 | HR 80 | Temp 97.7°F | Ht 66.0 in | Wt 105.6 lb

## 2019-06-23 DIAGNOSIS — Z7901 Long term (current) use of anticoagulants: Secondary | ICD-10-CM | POA: Diagnosis not present

## 2019-06-23 DIAGNOSIS — I482 Chronic atrial fibrillation, unspecified: Secondary | ICD-10-CM | POA: Diagnosis not present

## 2019-06-23 DIAGNOSIS — I119 Hypertensive heart disease without heart failure: Secondary | ICD-10-CM

## 2019-06-23 DIAGNOSIS — R296 Repeated falls: Secondary | ICD-10-CM

## 2019-06-23 HISTORY — DX: Repeated falls: R29.6

## 2019-06-23 MED ORDER — TORSEMIDE 20 MG PO TABS: 10.0000 mg | ORAL_TABLET | ORAL | 3 refills | Status: DC

## 2019-06-23 NOTE — Patient Instructions (Signed)
Medication Instructions:  Your physician has recommended you make the following change in your medication:  CHANGE: Torsemide 10 mg per 0.5mg  tablet. Take 0.5 of a tablet by mouth every other day. *If you need a refill on your cardiac medications before your next appointment, please call your pharmacy*   Lab Work: None If you have labs (blood work) drawn today and your tests are completely normal, you will receive your results only by: Marland Kitchen MyChart Message (if you have MyChart) OR . A paper copy in the mail If you have any lab test that is abnormal or we need to change your treatment, we will call you to review the results.   Testing/Procedures: None   Follow-Up: At [facility], you and your health needs are our priority.  As part of our continuing mission to provide you with exceptional heart care, we have created designated Provider Care Teams.  These Care Teams include your primary Cardiologist (physician) and Advanced Practice Providers (APPs -  Physician Assistants and Nurse Practitioners) who all work together to provide you with the care you need, when you need it.  We recommend signing up for the patient portal called "MyChart".  Sign up information is provided on this After Visit Summary.  MyChart is used to connect with patients for Virtual Visits (Telemedicine).  Patients are able to view lab/test results, encounter notes, upcoming appointments, etc.  Non-urgent messages can be sent to your provider as well.   To learn more about what you can do with MyChart, go to [email or web address].    Your next appointment:   3 month(s)  The format for your next appointment:   In Person  Provider:   Norman Herrlich, MD   Other Instructions

## 2019-06-24 DIAGNOSIS — H40013 Open angle with borderline findings, low risk, bilateral: Secondary | ICD-10-CM | POA: Diagnosis not present

## 2019-06-24 DIAGNOSIS — Z961 Presence of intraocular lens: Secondary | ICD-10-CM | POA: Diagnosis not present

## 2019-06-24 DIAGNOSIS — H52203 Unspecified astigmatism, bilateral: Secondary | ICD-10-CM | POA: Diagnosis not present

## 2019-06-24 DIAGNOSIS — H18513 Endothelial corneal dystrophy, bilateral: Secondary | ICD-10-CM | POA: Diagnosis not present

## 2019-06-30 DIAGNOSIS — R2681 Unsteadiness on feet: Secondary | ICD-10-CM | POA: Diagnosis not present

## 2019-06-30 DIAGNOSIS — M7989 Other specified soft tissue disorders: Secondary | ICD-10-CM | POA: Diagnosis not present

## 2019-06-30 DIAGNOSIS — I10 Essential (primary) hypertension: Secondary | ICD-10-CM | POA: Diagnosis not present

## 2019-06-30 DIAGNOSIS — I480 Paroxysmal atrial fibrillation: Secondary | ICD-10-CM | POA: Diagnosis not present

## 2019-06-30 DIAGNOSIS — Z7901 Long term (current) use of anticoagulants: Secondary | ICD-10-CM | POA: Diagnosis not present

## 2019-07-07 DIAGNOSIS — Z23 Encounter for immunization: Secondary | ICD-10-CM | POA: Diagnosis not present

## 2019-07-18 ENCOUNTER — Encounter: Payer: Self-pay | Admitting: Sports Medicine

## 2019-07-18 ENCOUNTER — Ambulatory Visit (INDEPENDENT_AMBULATORY_CARE_PROVIDER_SITE_OTHER): Payer: Medicare Other | Admitting: Sports Medicine

## 2019-07-18 ENCOUNTER — Other Ambulatory Visit: Payer: Self-pay

## 2019-07-18 DIAGNOSIS — I739 Peripheral vascular disease, unspecified: Secondary | ICD-10-CM | POA: Diagnosis not present

## 2019-07-18 DIAGNOSIS — B351 Tinea unguium: Secondary | ICD-10-CM | POA: Diagnosis not present

## 2019-07-18 DIAGNOSIS — M79675 Pain in left toe(s): Secondary | ICD-10-CM | POA: Diagnosis not present

## 2019-07-18 DIAGNOSIS — Z7901 Long term (current) use of anticoagulants: Secondary | ICD-10-CM

## 2019-07-18 DIAGNOSIS — M79674 Pain in right toe(s): Secondary | ICD-10-CM | POA: Diagnosis not present

## 2019-07-18 NOTE — Progress Notes (Signed)
Patient ID: Amber Cline, female   DOB: 12/29/1926, 86 y.o.   MRN: [id number] Subjective: Amber Cline is a 86 y.o. female patient seen today in office with complaint of painful thickened and elongated toenails; unable to trim. Reports same issue of swelling in legs and reports that she is still on Eliquis, no other pedal complaints at this time.  Patient Active Problem List   Diagnosis Date Noted  . Frequent falls 06/23/2019  . Hypothyroidism (acquired) 12/06/2017  . Angina pectoris ([place]) 06/06/2015  . Long term current use of anticoagulant 06/06/2015  . Chronic atrial fibrillation ([place]) 06/06/2015  . Hypertensive heart disease 06/06/2015    Current Outpatient Medications on File Prior to Visit  Medication Sig Dispense Refill  . Apoaequorin (PREVAGEN PO) Take 1 tablet by mouth daily.     . candesartan (ATACAND) 32 MG tablet TAKE 1 TABLET (32 MG TOTAL) BY MOUTH DAILY. 90 tablet 0  . clindamycin (CLEOCIN) 150 MG capsule TAKE ALL 4 CAPSULES 1 HOUR BEFORE ANY DENTAL APPOINTMENT  12  . Coenzyme Q10 (CO Q 10 PO) Take 1 tablet by mouth daily.     Marland Kitchen ELIQUIS 2.5 MG TABS tablet TAKE 1 TABLET BY MOUTH TWICE A DAY 180 tablet 2  . ipratropium (ATROVENT) 0.06 % nasal spray Place 2 sprays into both nostrils 2 (two) times daily as needed.    Marland Kitchen levothyroxine (SYNTHROID) 125 MCG tablet Take 125 mcg by mouth daily.    . metoprolol succinate (TOPROL-XL) 25 MG 24 hr tablet Take 25 mg by mouth as directed. Take 1 tablet in the morning and 12.5 mg in the evening    . metoprolol tartrate (LOPRESSOR) 25 MG tablet Take 25 mg by mouth 2 (two) times daily.    . Multiple Vitamins-Minerals (CENTRUM SILVER PO) Take 1 tablet by mouth daily.    . nitroGLYCERIN (NITROSTAT) 0.4 MG SL tablet Place 0.4 mg under the tongue every 5 (five) minutes x 3 doses as needed for chest pain.    . Probiotic Product (ALIGN PO) Take 1 tablet by mouth daily as needed.    . TIADYLT ER 240 MG 24 hr capsule TAKE 1 CAPSULE BY MOUTH  EVERY DAY 90 capsule 0  . torsemide (DEMADEX) 20 MG tablet Take 0.5 tablets (10 mg total) by mouth every other day. 22.5 tablet 3  . Vitamin D, Ergocalciferol, (DRISDOL) 50000 units CAPS capsule Take 50,000 Units by mouth every 7 (seven) days.      No current facility-administered medications on file prior to visit.    Allergies  Allergen Reactions  . Azithromycin Other (See Comments)    "flu like symptoms"  . Peanut Oil   . Penicillins   . Cortisone Other (See Comments)    "just cant tolerate it"    Objective: Physical Exam  General: Well developed, nourished, no acute distress, awake, alert and oriented x 3  Vascular: Dorsalis pedis artery 0/4 bilateral, Posterior tibial artery 1/4 faint bilateral, skin temperature warm to warm proximal to distal bilateral lower extremities, + varicosities,  +1 pitting edema bilateral ankles, Decreased pedal hair present bilateral. No acute signs of DVT no pain warmth or redness to calf.   Neurological: Gross sensation present via light touch bilateral.  Subjective numbness to both feet.  Dermatological: Skin is warm, dry, and supple bilateral, Nails 1-10 are tender, long, thick, and discolored with moderate subungal debris, no webspace macerations present bilateral, no open lesions present bilateral, no callus/corns/hyperkeratotic tissue present bilateral. No signs of infection  bilateral.  Musculoskeletal:  Hammertoe and bunion deformities noted bilateral. Muscular strength within normal limits without pain on range of motion. No pain with calf compression bilateral.  Assessment and Plan:  Problem List Items Addressed This Visit    None    Visit Diagnoses    Pain due to onychomycosis of toenails of both feet    -  Primary   PVD (peripheral vascular disease) (HCC)       Anticoagulant long-term use         -Examined patient.  -Re-Discussed treatment options for painful mycotic nails. -Mechanically debrided and reduced mycotic nails with  sterile nail nipper and dremel nail file without incident.  -Continue with elevation to assist with edema control like before and diuretic as tolerated  -Patient to return in 3 months/as needed for follow up evaluation or sooner if symptoms worsen.  [name], DPM

## 2019-08-04 DIAGNOSIS — Z23 Encounter for immunization: Secondary | ICD-10-CM | POA: Diagnosis not present

## 2019-08-30 ENCOUNTER — Other Ambulatory Visit: Payer: Self-pay | Admitting: Cardiology

## 2019-09-01 DIAGNOSIS — I10 Essential (primary) hypertension: Secondary | ICD-10-CM | POA: Diagnosis not present

## 2019-09-01 DIAGNOSIS — M7989 Other specified soft tissue disorders: Secondary | ICD-10-CM | POA: Diagnosis not present

## 2019-09-01 DIAGNOSIS — I480 Paroxysmal atrial fibrillation: Secondary | ICD-10-CM | POA: Diagnosis not present

## 2019-09-01 DIAGNOSIS — Z7901 Long term (current) use of anticoagulants: Secondary | ICD-10-CM | POA: Diagnosis not present

## 2019-09-01 DIAGNOSIS — R2681 Unsteadiness on feet: Secondary | ICD-10-CM | POA: Diagnosis not present

## 2019-10-04 NOTE — Progress Notes (Signed)
Cardiology Office Note:    Date:  10/06/2019   ID:  Amber Cline, DOB Dec 03, 1926, MRN [id number]  PCP:  Eloisa Northern, MD  Cardiologist:  Norman Herrlich, MD    Referring MD: Eloisa Northern, MD    ASSESSMENT:    1. Chronic atrial fibrillation (HCC)   2. Long term current use of anticoagulant   3. Frequent falls   4. Hypertensive heart disease without heart failure    PLAN:    In order of problems listed above:  1. For the first time along while she is improved she is less frail no falls atrial fibrillation is rate controlled beta-blocker and tolerates reduced dose anticoagulant without bleeding complication. 2. Strongly encouraged her to continue using a cane and go to physical therapy it should help to avoid falls and injury 3. Blood pressure target continue current treatment and minimal diuretic for chronic venous insufficiency   Next appointment: 6 months   Medication Adjustments/Labs and Tests Ordered: Current medicines are reviewed at length with the patient today.  Concerns regarding medicines are outlined above.  No orders of the defined types were placed in this encounter.  No orders of the defined types were placed in this encounter.   Chief Complaint  Patient presents with  . Follow-up  . Atrial Fibrillation  . Anticoagulation  . Hypertension    History of Present Illness:    Amber Cline is a 86 y.o. female with a hx of chronic atrial fibrillation, angina with normal coronary arteriography, HTN, and syncope seen 11/04/2018.  Another event monitor showed no bradycardia in rate controlled atrial fibrillation. She was noted to have chronic venous insufficiency with marked varicosities and mild dependent edema.  The was last seen 06/23/2019 with recurrent falls and lower extremity edema.  Her office EKG that date showed rate controlled atrial fibrillation. Compliance with diet, lifestyle and medications: Yes  She is doing better she uses a cane has had no  further falls and is getting ready to start physical therapy at [facility] and she will get the chest x-ray ordered previously performed.  Her legs are improved she has no bothersome edema no lightheadedness syncope chest pain shortness of breath.  She is due to have labs repeated in her primary care physician's office first week of August. Past Medical History:  Diagnosis Date  . Angina pectoris (HCC) 06/06/2015   Overview:  With normal coronary arteriography 2006  Overview:  With normal coronary arteriography 2006  . Chronic atrial fibrillation (HCC) 06/06/2015   Overview:  CHADS2 vasc score= 6  Overview:  CHADS2 vasc score= 6  . HCD (hypertensive cardiovascular disease) 06/06/2015  . Hypertensive heart disease 06/06/2015  . Hypothyroidism (acquired) 12/06/2017  . Long term current use of anticoagulant 06/06/2015    Past Surgical History:  Procedure Laterality Date  . CHOLECYSTECTOMY    . HAND SURGERY    . LITHOTRIPSY      Current Medications: Current Meds  Medication Sig  . Apoaequorin (PREVAGEN PO) Take 1 tablet by mouth daily.   . candesartan (ATACAND) 32 MG tablet TAKE 1 TABLET (32 MG TOTAL) BY MOUTH DAILY.  . clindamycin (CLEOCIN) 150 MG capsule TAKE ALL 4 CAPSULES 1 HOUR BEFORE ANY DENTAL APPOINTMENT  . Coenzyme Q10 (CO Q 10 PO) Take 1 tablet by mouth daily.   Marland Kitchen ELIQUIS 2.5 MG TABS tablet TAKE 1 TABLET BY MOUTH TWICE A DAY  . ipratropium (ATROVENT) 0.06 % nasal spray Place 2 sprays into both nostrils 2 (two)  times daily as needed.  Marland Kitchen levothyroxine (SYNTHROID) 125 MCG tablet Take 125 mcg by mouth daily.  . metoprolol succinate (TOPROL-XL) 25 MG 24 hr tablet Take 25 mg by mouth as directed. Take 1 tablet in the morning and 12.5 mg in the evening  . metoprolol tartrate (LOPRESSOR) 25 MG tablet Take 25 mg by mouth 2 (two) times daily.  . Multiple Vitamins-Minerals (CENTRUM SILVER PO) Take 1 tablet by mouth daily.  . nitroGLYCERIN (NITROSTAT) 0.4 MG SL tablet Place 0.4 mg under the  tongue every 5 (five) minutes x 3 doses as needed for chest pain.  . Probiotic Product (ALIGN PO) Take 1 tablet by mouth daily as needed.  Chelsea Aus ER 240 MG 24 hr capsule TAKE 1 CAPSULE BY MOUTH EVERY DAY  . Vitamin D, Ergocalciferol, (DRISDOL) 50000 units CAPS capsule Take 50,000 Units by mouth every 7 (seven) days.      Allergies:   Azithromycin, Peanut oil, Penicillins, and Cortisone   Social History   Socioeconomic History  . Marital status: Widowed    Spouse name: Not on file  . Number of children: Not on file  . Years of education: Not on file  . Highest education level: Not on file  Occupational History  . Not on file  Tobacco Use  . Smoking status: Never Smoker  . Smokeless tobacco: Never Used  Vaping Use  . Vaping Use: Never used  Substance and Sexual Activity  . Alcohol use: No  . Drug use: No  . Sexual activity: Not on file  Other Topics Concern  . Not on file  Social History Narrative  . Not on file   Social Determinants of Health   Financial Resource Strain:   . Difficulty of Paying Living Expenses:   Food Insecurity:   . Worried About Programme researcher, broadcasting/film/video in the Last Year:   . Barista in the Last Year:   Transportation Needs:   . Freight forwarder (Medical):   Marland Kitchen Lack of Transportation (Non-Medical):   Physical Activity:   . Days of Exercise per Week:   . Minutes of Exercise per Session:   Stress:   . Feeling of Stress :   Social Connections:   . Frequency of Communication with Friends and Family:   . Frequency of Social Gatherings with Friends and Family:   . Attends Religious Services:   . Active Member of Clubs or Organizations:   . Attends Banker Meetings:   Marland Kitchen Marital Status:      Family History: The patient's family history includes Deafness in her mother; Heart disease in her brother, father, and paternal grandfather. ROS:   Please see the history of present illness.    All other systems reviewed and are  negative.  EKGs/Labs/Other Studies Reviewed:    The following studies were reviewed today  Recent Labs: 04/29/2019 creatinine 0.93 TSH normal  Physical Exam:    VS:  BP (!) 160/80 (BP Location: Right Arm, Patient Position: Sitting, Cuff Size: Small)   Pulse 68   Ht 5\' 6"  (1.676 m)   Wt 104 lb (47.2 kg)   SpO2 95%   BMI 16.79 kg/m     Wt Readings from Last 3 Encounters:  10/06/19 104 lb (47.2 kg)  06/23/19 105 lb 9.6 oz (47.9 kg)  11/04/18 108 lb 12.8 oz (49.4 kg)     GEN: She still looks frail but stronger  in no acute distress HEENT: Normal NECK: No JVD; No  carotid bruits LYMPHATICS: No lymphadenopathy CARDIAC: Irregular rhythm S1 variable no murmurs, rubs, gallops RESPIRATORY:  Clear to auscultation without rales, wheezing or rhonchi  ABDOMEN: Soft, non-tender, non-distended MUSCULOSKELETAL:  No edema; No deformity  SKIN: Warm and dry NEUROLOGIC:  Alert and oriented x 3 PSYCHIATRIC:  Normal affect    Signed, Norman Herrlich, MD  10/06/2019 3:56 PM    [facility]

## 2019-10-06 ENCOUNTER — Encounter: Payer: Self-pay | Admitting: Cardiology

## 2019-10-06 ENCOUNTER — Other Ambulatory Visit: Payer: Self-pay

## 2019-10-06 ENCOUNTER — Ambulatory Visit: Payer: Medicare Other | Admitting: Cardiology

## 2019-10-06 ENCOUNTER — Ambulatory Visit (INDEPENDENT_AMBULATORY_CARE_PROVIDER_SITE_OTHER): Payer: Medicare Other | Admitting: Cardiology

## 2019-10-06 VITALS — BP 160/80 | HR 68 | Ht 66.0 in | Wt 104.0 lb

## 2019-10-06 DIAGNOSIS — I482 Chronic atrial fibrillation, unspecified: Secondary | ICD-10-CM

## 2019-10-06 DIAGNOSIS — Z7901 Long term (current) use of anticoagulants: Secondary | ICD-10-CM | POA: Diagnosis not present

## 2019-10-06 DIAGNOSIS — I119 Hypertensive heart disease without heart failure: Secondary | ICD-10-CM | POA: Diagnosis not present

## 2019-10-06 DIAGNOSIS — R296 Repeated falls: Secondary | ICD-10-CM | POA: Diagnosis not present

## 2019-10-06 NOTE — Patient Instructions (Signed)

## 2019-10-17 ENCOUNTER — Encounter: Payer: Self-pay | Admitting: Sports Medicine

## 2019-10-17 ENCOUNTER — Other Ambulatory Visit: Payer: Self-pay

## 2019-10-17 ENCOUNTER — Ambulatory Visit (INDEPENDENT_AMBULATORY_CARE_PROVIDER_SITE_OTHER): Payer: Medicare Other | Admitting: Sports Medicine

## 2019-10-17 DIAGNOSIS — I739 Peripheral vascular disease, unspecified: Secondary | ICD-10-CM | POA: Diagnosis not present

## 2019-10-17 DIAGNOSIS — M79674 Pain in right toe(s): Secondary | ICD-10-CM | POA: Diagnosis not present

## 2019-10-17 DIAGNOSIS — M79675 Pain in left toe(s): Secondary | ICD-10-CM

## 2019-10-17 DIAGNOSIS — B351 Tinea unguium: Secondary | ICD-10-CM

## 2019-10-17 DIAGNOSIS — Z7901 Long term (current) use of anticoagulants: Secondary | ICD-10-CM | POA: Diagnosis not present

## 2019-10-17 NOTE — Progress Notes (Signed)
Patient ID: ISBELLA ARLINE, female   DOB: September 27, 1926, 86 y.o.   MRN: [id number] Subjective: BAILY HOVANEC is a 86 y.o. female patient seen today in office with complaint of painful thickened and elongated toenails; unable to trim. Reports that she does not feel well today has been struggling with shortness of breath and reports that she is still on Eliquis, no other pedal complaints at this time.  Patient Active Problem List   Diagnosis Date Noted  . Frequent falls 06/23/2019  . Hypothyroidism (acquired) 12/06/2017  . Angina pectoris (HCC) 06/06/2015  . Long term current use of anticoagulant 06/06/2015  . Chronic atrial fibrillation (HCC) 06/06/2015  . Hypertensive heart disease 06/06/2015    Current Outpatient Medications on File Prior to Visit  Medication Sig Dispense Refill  . Apoaequorin (PREVAGEN PO) Take 1 tablet by mouth daily.     . candesartan (ATACAND) 32 MG tablet TAKE 1 TABLET (32 MG TOTAL) BY MOUTH DAILY. 90 tablet 0  . clindamycin (CLEOCIN) 150 MG capsule TAKE ALL 4 CAPSULES 1 HOUR BEFORE ANY DENTAL APPOINTMENT  12  . Coenzyme Q10 (CO Q 10 PO) Take 1 tablet by mouth daily.     Marland Kitchen ELIQUIS 2.5 MG TABS tablet TAKE 1 TABLET BY MOUTH TWICE A DAY 180 tablet 2  . ipratropium (ATROVENT) 0.06 % nasal spray Place 2 sprays into both nostrils 2 (two) times daily as needed.    Marland Kitchen levothyroxine (SYNTHROID) 125 MCG tablet Take 125 mcg by mouth daily.    . metoprolol succinate (TOPROL-XL) 25 MG 24 hr tablet Take 25 mg by mouth as directed. Take 1 tablet in the morning and 12.5 mg in the evening    . metoprolol tartrate (LOPRESSOR) 25 MG tablet Take 25 mg by mouth 2 (two) times daily.    . Multiple Vitamins-Minerals (CENTRUM SILVER PO) Take 1 tablet by mouth daily.    . nitroGLYCERIN (NITROSTAT) 0.4 MG SL tablet Place 0.4 mg under the tongue every 5 (five) minutes x 3 doses as needed for chest pain.    . Probiotic Product (ALIGN PO) Take 1 tablet by mouth daily as needed.    . TIADYLT ER  240 MG 24 hr capsule TAKE 1 CAPSULE BY MOUTH EVERY DAY 90 capsule 0  . torsemide (DEMADEX) 20 MG tablet Take 0.5 tablets (10 mg total) by mouth every other day. 22.5 tablet 3  . Vitamin D, Ergocalciferol, (DRISDOL) 50000 units CAPS capsule Take 50,000 Units by mouth every 7 (seven) days.      No current facility-administered medications on file prior to visit.    Allergies  Allergen Reactions  . Azithromycin Other (See Comments)    "flu like symptoms"  . Peanut Oil   . Penicillins   . Cortisone Other (See Comments)    "just cant tolerate it"    Objective: Physical Exam  General: Well developed, nourished, no acute distress, awake, alert and oriented x 3  Vascular: Dorsalis pedis artery 0/4 bilateral, Posterior tibial artery 1/4 faint bilateral, skin temperature warm to warm proximal to distal bilateral lower extremities, + varicosities,  +1 pitting edema bilateral ankles, Decreased pedal hair present bilateral. No acute signs of DVT no pain warmth or redness to calf.   Neurological: Gross sensation present via light touch bilateral.  Subjective numbness to both feet.  Dermatological: Skin is warm, dry, and supple bilateral, Nails 1-10 are tender, long, thick, and discolored with moderate subungal debris, no webspace macerations present bilateral, no open lesions present bilateral, no  callus/corns/hyperkeratotic tissue present bilateral. No signs of infection bilateral.  Musculoskeletal:  Hammertoe and bunion deformities noted bilateral. Muscular strength within normal limits without pain on range of motion. No pain with calf compression bilateral.  Assessment and Plan:  Problem List Items Addressed This Visit    None    Visit Diagnoses    Pain due to onychomycosis of toenails of both feet    -  Primary   PVD (peripheral vascular disease) (HCC)       Anticoagulant long-term use         -Examined patient.  -Re-Discussed treatment options for painful mycotic  nails. -Mechanically debrided and reduced mycotic nails with sterile nail nipper and dremel nail file without incident.  -Continue with e follow-up with cardiology -Patient to return in 3 months/as needed for follow up evaluation or sooner if symptoms worsen.  [name], DPM

## 2019-11-13 ENCOUNTER — Telehealth: Payer: Self-pay | Admitting: Cardiology

## 2019-11-13 NOTE — Telephone Encounter (Signed)
Pt c/o Shortness Of Breath: STAT if SOB developed within the last 24 hours or pt is noticeably SOB on the phone  1. Are you currently SOB (can you hear that pt is SOB on the phone)? no  2. How long have you been experiencing SOB? Since last visit  3. Are you SOB when sitting or when up moving around? Moving around  4. Are you currently experiencing any other symptoms? Fatigue   Patient's son states the patient has been having SOB and fatigue since her last visit in July. He states her chest pain has diminished and she does not get it as much as before. He states she gets SOB when moving around and it goes away after sitting. He states she is not having any symptoms now.

## 2019-11-13 NOTE — Telephone Encounter (Signed)
Take torsemide 10 mg day

## 2019-11-13 NOTE — Telephone Encounter (Signed)
Spoke to the patients son just now and let him know Dr. Munley's recommendations. He verbalizes understanding and thanks me for the call back.  

## 2019-11-23 ENCOUNTER — Other Ambulatory Visit: Payer: Self-pay | Admitting: Cardiology

## 2019-12-03 DIAGNOSIS — E039 Hypothyroidism, unspecified: Secondary | ICD-10-CM | POA: Diagnosis not present

## 2019-12-03 DIAGNOSIS — Z23 Encounter for immunization: Secondary | ICD-10-CM | POA: Diagnosis not present

## 2019-12-03 DIAGNOSIS — I10 Essential (primary) hypertension: Secondary | ICD-10-CM | POA: Diagnosis not present

## 2019-12-03 DIAGNOSIS — I4891 Unspecified atrial fibrillation: Secondary | ICD-10-CM | POA: Diagnosis not present

## 2019-12-03 DIAGNOSIS — R262 Difficulty in walking, not elsewhere classified: Secondary | ICD-10-CM | POA: Diagnosis not present

## 2019-12-03 DIAGNOSIS — R634 Abnormal weight loss: Secondary | ICD-10-CM | POA: Diagnosis not present

## 2020-01-08 ENCOUNTER — Encounter: Payer: Self-pay | Admitting: Cardiology

## 2020-01-08 ENCOUNTER — Ambulatory Visit (INDEPENDENT_AMBULATORY_CARE_PROVIDER_SITE_OTHER): Payer: Medicare Other | Admitting: Cardiology

## 2020-01-08 ENCOUNTER — Other Ambulatory Visit: Payer: Self-pay

## 2020-01-08 ENCOUNTER — Ambulatory Visit: Payer: Medicare Other

## 2020-01-08 VITALS — BP 130/70 | HR 86 | Ht 66.0 in | Wt 104.6 lb

## 2020-01-08 DIAGNOSIS — I110 Hypertensive heart disease with heart failure: Secondary | ICD-10-CM

## 2020-01-08 DIAGNOSIS — I5032 Chronic diastolic (congestive) heart failure: Secondary | ICD-10-CM | POA: Diagnosis not present

## 2020-01-08 DIAGNOSIS — Z7901 Long term (current) use of anticoagulants: Secondary | ICD-10-CM

## 2020-01-08 DIAGNOSIS — R296 Repeated falls: Secondary | ICD-10-CM

## 2020-01-08 DIAGNOSIS — I209 Angina pectoris, unspecified: Secondary | ICD-10-CM

## 2020-01-08 DIAGNOSIS — I482 Chronic atrial fibrillation, unspecified: Secondary | ICD-10-CM

## 2020-01-08 NOTE — Progress Notes (Signed)
Cardiology Office Note:    Date:  01/08/2020   ID:  Amber Cline, DOB 04/16/1926, MRN [id number]  PCP:  Eloisa Northern, MD  Cardiologist:  Norman Herrlich, MD    Referring MD: Eloisa Northern, MD    ASSESSMENT:    1. Chronic atrial fibrillation (HCC)   2. Long term current use of anticoagulant   3. Frequent falls   4. Hypertensive heart disease with chronic diastolic congestive heart failure (HCC)   5. Angina pectoris (HCC)    PLAN:    In order of problems listed above:  1. Stable rate controlled continue calcium channel blocker reduced dose anticoagulant check 3-day ZIO monitor however these episodes are not typical of bradycardia and I suspect the predominant problem is frailty.  I told her to use a walker at all times 2. Continue the reduced dose anticoagulant to take twice daily 3. Continue to use a walker at all times she is supervised by her son 4. Stable BP at target and no orthostasis 5. Stable no recurrence   Next appointment: 6 months   Medication Adjustments/Labs and Tests Ordered: Current medicines are reviewed at length with the patient today.  Concerns regarding medicines are outlined above.  Orders Placed This Encounter  Procedures  . Basic metabolic panel  . CBC  . LONG TERM MONITOR (3-14 DAYS)  . LONG TERM MONITOR (3-14 DAYS)   No orders of the defined types were placed in this encounter.   Chief Complaint  Patient presents with  . Follow-up  . Coronary Artery Disease  . Anticoagulation  . Hypertension    History of Present Illness:    Amber Cline is a 86 y.o. female with a hx of chronic atrial fibrillation anticoagulated hypertension and angina with normal coronary arteriography.  She was last seen 10/06/2019. Compliance with diet, lifestyle and medications: Yes  Unfortunately since seen by me she has become increasingly frail and has had 2 falls without severe trauma.  Since the last fall she complains of lower back pain with prolonged  standing.  Some fashion her reduced dose anticoagulant was decreased to once daily we will have her go back to twice a day.  Looking for a minimal causes only check labs including a CBC CMP and although we done multiple monitors reapply a ZIO monitor for 3 days looking for significant bradycardia.  She does not lose consciousness she becomes unsteady falls to the ground and has been witnessed no seizure activity.  In the office today no significant orthostatic shift or hypotension. Past Medical History:  Diagnosis Date  . Angina pectoris (HCC) 06/06/2015   Overview:  With normal coronary arteriography 2006  Overview:  With normal coronary arteriography 2006  . Chronic atrial fibrillation (HCC) 06/06/2015   Overview:  CHADS2 vasc score= 6  Overview:  CHADS2 vasc score= 6  . Frequent falls 06/23/2019  . HCD (hypertensive cardiovascular disease) 06/06/2015  . Hypertensive heart disease 06/06/2015  . Hypothyroidism (acquired) 12/06/2017  . Long term current use of anticoagulant 06/06/2015    Past Surgical History:  Procedure Laterality Date  . CHOLECYSTECTOMY    . HAND SURGERY    . LITHOTRIPSY      Current Medications: Current Meds  Medication Sig  . Apoaequorin (PREVAGEN PO) Take 1 tablet by mouth daily.   . candesartan (ATACAND) 32 MG tablet TAKE 1 TABLET (32 MG TOTAL) BY MOUTH DAILY.  . clindamycin (CLEOCIN) 150 MG capsule TAKE ALL 4 CAPSULES 1 HOUR BEFORE ANY DENTAL APPOINTMENT  .  Coenzyme Q10 (CO Q 10 PO) Take 1 tablet by mouth daily.   Marland Kitchen ELIQUIS 2.5 MG TABS tablet TAKE 1 TABLET BY MOUTH TWICE A DAY (Patient taking differently: Take 2.5 mg by mouth daily. )  . Glucosamine-Chondroit-Vit C-Mn (GLUCOSAMINE CHONDR 1500 COMPLX) CAPS Take 1 capsule by mouth daily.  Marland Kitchen levothyroxine (SYNTHROID) 125 MCG tablet Take 125 mcg by mouth daily.  . metoprolol tartrate (LOPRESSOR) 25 MG tablet Take 25 mg by mouth. TAKE 1 TABLET 25 MG AM AND 0.5 TABLET ( 12.5 MG ) EVERY PM  . Multiple Vitamins-Minerals  (CENTRUM SILVER PO) Take 1 tablet by mouth daily.  . nitroGLYCERIN (NITROSTAT) 0.4 MG SL tablet Place 0.4 mg under the tongue every 5 (five) minutes x 3 doses as needed for chest pain.  . Probiotic Product (ALIGN PO) Take 1 tablet by mouth daily as needed.  Amber Cline ER 240 MG 24 hr capsule TAKE 1 CAPSULE BY MOUTH EVERY DAY  . torsemide (DEMADEX) 20 MG tablet Take 0.5 tablets (10 mg total) by mouth every other day. (Patient taking differently: Take 10 mg by mouth daily. Take  0.5 tablet ( 5 mg ) every morning)  . Vitamin D, Ergocalciferol, (DRISDOL) 50000 units CAPS capsule Take 50,000 Units by mouth every 7 (seven) days.      Allergies:   Azithromycin, Peanut oil, Penicillins, and Cortisone   Social History   Socioeconomic History  . Marital status: Widowed    Spouse name: Not on file  . Number of children: Not on file  . Years of education: Not on file  . Highest education level: Not on file  Occupational History  . Not on file  Tobacco Use  . Smoking status: Never Smoker  . Smokeless tobacco: Never Used  Vaping Use  . Vaping Use: Never used  Substance and Sexual Activity  . Alcohol use: No  . Drug use: No  . Sexual activity: Not on file  Other Topics Concern  . Not on file  Social History Narrative  . Not on file   Social Determinants of Health   Financial Resource Strain:   . Difficulty of Paying Living Expenses: Not on file  Food Insecurity:   . Worried About Programme researcher, broadcasting/film/video in the Last Year: Not on file  . Ran Out of Food in the Last Year: Not on file  Transportation Needs:   . Lack of Transportation (Medical): Not on file  . Lack of Transportation (Non-Medical): Not on file  Physical Activity:   . Days of Exercise per Week: Not on file  . Minutes of Exercise per Session: Not on file  Stress:   . Feeling of Stress : Not on file  Social Connections:   . Frequency of Communication with Friends and Family: Not on file  . Frequency of Social Gatherings with  Friends and Family: Not on file  . Attends Religious Services: Not on file  . Active Member of Clubs or Organizations: Not on file  . Attends Banker Meetings: Not on file  . Marital Status: Not on file     Family History: The patient's family history includes Deafness in her mother; Heart disease in her brother, father, and paternal grandfather. ROS:   Please see the history of present illness.    All other systems reviewed and are negative.  EKGs/Labs/Other Studies Reviewed:    The following studies were reviewed today:    Recent Labs: Per PCP 04/29/2019 creatinine 0.93 TSH normal  Physical  Exam:    VS:  BP 130/70 (BP Location: Right Arm, Patient Position: Standing, Cuff Size: Normal)   Pulse 86   Ht 5\' 6"  (1.676 m)   Wt 104 lb 9.6 oz (47.4 kg)   SpO2 96%   BMI 16.88 kg/m     Wt Readings from Last 3 Encounters:  01/08/20 104 lb 9.6 oz (47.4 kg)  10/06/19 104 lb (47.2 kg)  06/23/19 105 lb 9.6 oz (47.9 kg)     GEN: She looks increasingly frail well nourished, well developed in no acute distress HEENT: Normal NECK: No JVD; No carotid bruits LYMPHATICS: No lymphadenopathy CARDIAC: Irregular S1 variable no murmurs, rubs, gallops RESPIRATORY:  Clear to auscultation without rales, wheezing or rhonchi  ABDOMEN: Soft, non-tender, non-distended MUSCULOSKELETAL:  No edema; No deformity  SKIN: Warm and dry NEUROLOGIC:  Alert and oriented x 3 PSYCHIATRIC:  Normal affect    Signed, 08/23/19, MD  01/08/2020 3:57 PM    [facility]

## 2020-01-08 NOTE — Patient Instructions (Signed)
Medication Instructions:  Your physician recommends that you continue on your current medications as directed. Please refer to the Current Medication list given to you today.  Please take your Eliquis twice daily.  *If you need a refill on your cardiac medications before your next appointment, please call your pharmacy*   Lab Work: Your physician recommends that you return for lab work in: TODAY CBC, CMP If you have labs (blood work) drawn today and your tests are completely normal, you will receive your results only by: Marland Kitchen MyChart Message (if you have MyChart) OR . A paper copy in the mail If you have any lab test that is abnormal or we need to change your treatment, we will call you to review the results.   Testing/Procedures: A zio monitor was ordered today. It will remain on for 3 days. You will then return monitor and event diary in provided box. It takes 1-2 weeks for report to be downloaded and returned to [place]. We will call you with the results. If monitor falls off or has orange flashing light, please call Zio for further instructions.      Follow-Up: At [facility], you and your health needs are our priority.  As part of our continuing mission to provide you with exceptional heart care, we have created designated Provider Care Teams.  These Care Teams include your primary Cardiologist (physician) and Advanced Practice Providers (APPs -  Physician Assistants and Nurse Practitioners) who all work together to provide you with the care you need, when you need it.  We recommend signing up for the patient portal called "MyChart".  Sign up information is provided on this After Visit Summary.  MyChart is used to connect with patients for Virtual Visits (Telemedicine).  Patients are able to view lab/test results, encounter notes, upcoming appointments, etc.  Non-urgent messages can be sent to your provider as well.   To learn more about what you can do with MyChart, go to  [email or web address].    Your next appointment:   6 month(s)  The format for your next appointment:   In Person  Provider:   Norman Herrlich, MD   Other Instructions

## 2020-01-09 ENCOUNTER — Telehealth: Payer: Self-pay

## 2020-01-09 LAB — BASIC METABOLIC PANEL
BUN/Creatinine Ratio: 22 (ref 12–28)
BUN: 23 mg/dL (ref 10–36)
CO2: 27 mmol/L (ref 20–29)
Calcium: 10.5 mg/dL — ABNORMAL HIGH (ref 8.7–10.3)
Chloride: 101 mmol/L (ref 96–106)
Creatinine, Ser: 1.05 mg/dL — ABNORMAL HIGH (ref 0.57–1.00)
GFR calc Af Amer: 54 mL/min/{1.73_m2} — ABNORMAL LOW (ref >59)
GFR calc non Af Amer: 47 mL/min/{1.73_m2} — ABNORMAL LOW (ref >59)
Glucose: 87 mg/dL (ref 65–99)
Potassium: 4.5 mmol/L (ref 3.5–5.2)
Sodium: 140 mmol/L (ref 134–144)

## 2020-01-09 LAB — CBC
Hematocrit: 38.6 % (ref 34.0–46.6)
Hemoglobin: 12.6 g/dL (ref 11.1–15.9)
MCH: 30.6 pg (ref 26.6–33.0)
MCHC: 32.6 g/dL (ref 31.5–35.7)
MCV: 94 fL (ref 79–97)
Platelets: 176 10*3/uL (ref 150–450)
RBC: 4.12 x10E6/uL (ref 3.77–5.28)
RDW: 11.7 % (ref 11.7–15.4)
WBC: 7.5 10*3/uL (ref 3.4–10.8)

## 2020-01-09 NOTE — Telephone Encounter (Signed)
Spoke with patient regarding results and recommendation.  Patient verbalizes understanding and is agreeable to plan of care. Advised patient to call back with any issues or concerns.  

## 2020-01-09 NOTE — Telephone Encounter (Signed)
-----   Message from Baldo Daub, MD sent at 01/09/2020  8:02 AM EDT ----- Good result no changes

## 2020-01-20 ENCOUNTER — Ambulatory Visit: Payer: Medicare Other | Admitting: Sports Medicine

## 2020-01-21 DIAGNOSIS — I482 Chronic atrial fibrillation, unspecified: Secondary | ICD-10-CM | POA: Diagnosis not present

## 2020-01-27 ENCOUNTER — Ambulatory Visit (INDEPENDENT_AMBULATORY_CARE_PROVIDER_SITE_OTHER): Payer: Medicare Other | Admitting: Sports Medicine

## 2020-01-27 ENCOUNTER — Encounter: Payer: Self-pay | Admitting: Sports Medicine

## 2020-01-27 ENCOUNTER — Other Ambulatory Visit: Payer: Self-pay

## 2020-01-27 DIAGNOSIS — B351 Tinea unguium: Secondary | ICD-10-CM | POA: Diagnosis not present

## 2020-01-27 DIAGNOSIS — Z7901 Long term (current) use of anticoagulants: Secondary | ICD-10-CM | POA: Diagnosis not present

## 2020-01-27 DIAGNOSIS — M79674 Pain in right toe(s): Secondary | ICD-10-CM | POA: Diagnosis not present

## 2020-01-27 DIAGNOSIS — M79675 Pain in left toe(s): Secondary | ICD-10-CM | POA: Diagnosis not present

## 2020-01-27 DIAGNOSIS — I739 Peripheral vascular disease, unspecified: Secondary | ICD-10-CM

## 2020-01-27 NOTE — Progress Notes (Signed)
Patient ID: Amber Cline, female   DOB: 1926/04/20, 86 y.o.   MRN: [id number] Subjective: Amber Cline is a 86 y.o. female patient seen today in office with complaint of painful thickened and elongated toenails; unable to trim. Reports that she does not feel well today, she hurts all over, still on Eliquis, no other pedal complaints at this time.  Patient Active Problem List   Diagnosis Date Noted  . Frequent falls 06/23/2019  . Hypothyroidism (acquired) 12/06/2017  . Angina pectoris (HCC) 06/06/2015  . Long term current use of anticoagulant 06/06/2015  . Chronic atrial fibrillation (HCC) 06/06/2015  . Hypertensive heart disease 06/06/2015  . HCD (hypertensive cardiovascular disease) 06/06/2015    Current Outpatient Medications on File Prior to Visit  Medication Sig Dispense Refill  . Apoaequorin (PREVAGEN PO) Take 1 tablet by mouth daily.     . candesartan (ATACAND) 32 MG tablet TAKE 1 TABLET (32 MG TOTAL) BY MOUTH DAILY. 90 tablet 0  . clindamycin (CLEOCIN) 150 MG capsule TAKE ALL 4 CAPSULES 1 HOUR BEFORE ANY DENTAL APPOINTMENT  12  . Coenzyme Q10 (CO Q 10 PO) Take 1 tablet by mouth daily.     Marland Kitchen ELIQUIS 2.5 MG TABS tablet TAKE 1 TABLET BY MOUTH TWICE A DAY (Patient taking differently: Take 2.5 mg by mouth daily. ) 180 tablet 2  . Glucosamine-Chondroit-Vit C-Mn (GLUCOSAMINE CHONDR 1500 COMPLX) CAPS Take 1 capsule by mouth daily.    Marland Kitchen ipratropium (ATROVENT) 0.06 % nasal spray Place 2 sprays into both nostrils 2 (two) times daily as needed. (Patient not taking: Reported on 01/08/2020)    . levothyroxine (SYNTHROID) 125 MCG tablet Take 125 mcg by mouth daily.    . metoprolol tartrate (LOPRESSOR) 25 MG tablet Take 25 mg by mouth. TAKE 1 TABLET 25 MG AM AND 0.5 TABLET ( 12.5 MG ) EVERY PM    . Multiple Vitamins-Minerals (CENTRUM SILVER PO) Take 1 tablet by mouth daily.    . nitroGLYCERIN (NITROSTAT) 0.4 MG SL tablet Place 0.4 mg under the tongue every 5 (five) minutes x 3 doses as needed  for chest pain.    . Probiotic Product (ALIGN PO) Take 1 tablet by mouth daily as needed.    . TIADYLT ER 240 MG 24 hr capsule TAKE 1 CAPSULE BY MOUTH EVERY DAY 90 capsule 0  . torsemide (DEMADEX) 20 MG tablet Take 0.5 tablets (10 mg total) by mouth every other day. (Patient taking differently: Take 10 mg by mouth daily. Take  0.5 tablet ( 5 mg ) every morning) 22.5 tablet 3  . Vitamin D, Ergocalciferol, (DRISDOL) 50000 units CAPS capsule Take 50,000 Units by mouth every 7 (seven) days.      No current facility-administered medications on file prior to visit.    Allergies  Allergen Reactions  . Azithromycin Other (See Comments)    "flu like symptoms"  . Peanut Oil   . Penicillins   . Cortisone Other (See Comments)    "just cant tolerate it"    Objective: Physical Exam  General: Well developed, nourished, no acute distress, awake, alert and oriented x 3  Vascular: Dorsalis pedis artery 0/4 bilateral, Posterior tibial artery 1/4 faint bilateral, skin temperature warm to warm proximal to distal bilateral lower extremities, + varicosities,  +1 pitting edema bilateral ankles, Decreased pedal hair present bilateral. No acute signs of DVT no pain warmth or redness to calf.   Neurological: Gross sensation present via light touch bilateral.  Subjective numbness to both feet.  Dermatological: Skin is warm, dry, and supple bilateral, Nails 1-10 are tender, long, thick, and discolored with moderate subungal debris, no webspace macerations present bilateral, no open lesions present bilateral, no callus/corns/hyperkeratotic tissue present bilateral. No signs of infection bilateral.  Musculoskeletal:  Hammertoe and bunion deformities noted bilateral. Muscular strength within normal limits without pain on range of motion. No pain with calf compression bilateral.  Assessment and Plan:  Problem List Items Addressed This Visit    None    Visit Diagnoses    Pain due to onychomycosis of toenails of  both feet    -  Primary   PVD (peripheral vascular disease) (HCC)       Anticoagulant long-term use         -Examined patient.  -Re-Discussed treatment options for painful mycotic nails. -Mechanically debrided and reduced mycotic nails with sterile nail nipper and dremel nail file without incident.  -Continue with follow-up with cardiology like before for edema  -Patient to return in 3 months/as needed for follow up evaluation or sooner if symptoms worsen.  [name], DPM

## 2020-01-31 ENCOUNTER — Other Ambulatory Visit: Payer: Self-pay | Admitting: Cardiology

## 2020-02-09 DIAGNOSIS — I5032 Chronic diastolic (congestive) heart failure: Secondary | ICD-10-CM | POA: Diagnosis not present

## 2020-02-09 DIAGNOSIS — Z1331 Encounter for screening for depression: Secondary | ICD-10-CM | POA: Diagnosis not present

## 2020-02-09 DIAGNOSIS — R262 Difficulty in walking, not elsewhere classified: Secondary | ICD-10-CM | POA: Diagnosis not present

## 2020-02-09 DIAGNOSIS — E039 Hypothyroidism, unspecified: Secondary | ICD-10-CM | POA: Diagnosis not present

## 2020-02-09 DIAGNOSIS — R627 Adult failure to thrive: Secondary | ICD-10-CM | POA: Diagnosis not present

## 2020-02-09 DIAGNOSIS — Z0000 Encounter for general adult medical examination without abnormal findings: Secondary | ICD-10-CM | POA: Diagnosis not present

## 2020-02-09 DIAGNOSIS — Z681 Body mass index (BMI) 19 or less, adult: Secondary | ICD-10-CM | POA: Diagnosis not present

## 2020-02-09 DIAGNOSIS — I2510 Atherosclerotic heart disease of native coronary artery without angina pectoris: Secondary | ICD-10-CM | POA: Diagnosis not present

## 2020-02-09 DIAGNOSIS — I4821 Permanent atrial fibrillation: Secondary | ICD-10-CM | POA: Diagnosis not present

## 2020-04-30 ENCOUNTER — Ambulatory Visit: Payer: Medicare Other | Admitting: Sports Medicine

## 2020-05-17 DIAGNOSIS — R600 Localized edema: Secondary | ICD-10-CM | POA: Diagnosis not present

## 2020-05-17 DIAGNOSIS — I10 Essential (primary) hypertension: Secondary | ICD-10-CM | POA: Diagnosis not present

## 2020-05-17 DIAGNOSIS — E039 Hypothyroidism, unspecified: Secondary | ICD-10-CM | POA: Diagnosis not present

## 2020-05-28 ENCOUNTER — Ambulatory Visit (INDEPENDENT_AMBULATORY_CARE_PROVIDER_SITE_OTHER): Payer: Medicare Other | Admitting: Sports Medicine

## 2020-05-28 ENCOUNTER — Other Ambulatory Visit: Payer: Self-pay

## 2020-05-28 ENCOUNTER — Encounter: Payer: Self-pay | Admitting: Sports Medicine

## 2020-05-28 DIAGNOSIS — M79675 Pain in left toe(s): Secondary | ICD-10-CM | POA: Diagnosis not present

## 2020-05-28 DIAGNOSIS — M79674 Pain in right toe(s): Secondary | ICD-10-CM

## 2020-05-28 DIAGNOSIS — Z7901 Long term (current) use of anticoagulants: Secondary | ICD-10-CM | POA: Diagnosis not present

## 2020-05-28 DIAGNOSIS — B351 Tinea unguium: Secondary | ICD-10-CM | POA: Diagnosis not present

## 2020-05-28 DIAGNOSIS — I739 Peripheral vascular disease, unspecified: Secondary | ICD-10-CM | POA: Diagnosis not present

## 2020-05-28 NOTE — Progress Notes (Signed)
Patient ID: Amber Cline, female   DOB: 01-12-1927, 87 y.o.   MRN: [id number] Subjective: Amber Cline is a 87 y.o. female patient seen today in office with complaint of painful thickened and elongated toenails; unable to trim. Reports that she feels 1/2 good today, still on Eliquis and will see the heart doctor next month. Reports swelling in feet and legs that come and go as well as some tingles. No other pedal complaints at this time.  Patient Active Problem List   Diagnosis Date Noted  . Frequent falls 06/23/2019  . Hypothyroidism (acquired) 12/06/2017  . Angina pectoris (HCC) 06/06/2015  . Long term current use of anticoagulant 06/06/2015  . Chronic atrial fibrillation (HCC) 06/06/2015  . Hypertensive heart disease 06/06/2015  . HCD (hypertensive cardiovascular disease) 06/06/2015    Current Outpatient Medications on File Prior to Visit  Medication Sig Dispense Refill  . Apoaequorin (PREVAGEN PO) Take 1 tablet by mouth daily.     . candesartan (ATACAND) 32 MG tablet TAKE 1 TABLET (32 MG TOTAL) BY MOUTH DAILY. 90 tablet 3  . clindamycin (CLEOCIN) 150 MG capsule TAKE ALL 4 CAPSULES 1 HOUR BEFORE ANY DENTAL APPOINTMENT  12  . Coenzyme Q10 (CO Q 10 PO) Take 1 tablet by mouth daily.     Marland Kitchen ELIQUIS 2.5 MG TABS tablet TAKE 1 TABLET BY MOUTH TWICE A DAY (Patient taking differently: Take 2.5 mg by mouth daily. ) 180 tablet 2  . Glucosamine-Chondroit-Vit C-Mn (GLUCOSAMINE CHONDR 1500 COMPLX) CAPS Take 1 capsule by mouth daily.    Marland Kitchen ipratropium (ATROVENT) 0.06 % nasal spray Place 2 sprays into both nostrils 2 (two) times daily as needed. (Patient not taking: Reported on 01/08/2020)    . levothyroxine (SYNTHROID) 125 MCG tablet Take 125 mcg by mouth daily.    . metoprolol tartrate (LOPRESSOR) 25 MG tablet Take 25 mg by mouth. TAKE 1 TABLET 25 MG AM AND 0.5 TABLET ( 12.5 MG ) EVERY PM    . Multiple Vitamins-Minerals (CENTRUM SILVER PO) Take 1 tablet by mouth daily.    . nitroGLYCERIN  (NITROSTAT) 0.4 MG SL tablet Place 0.4 mg under the tongue every 5 (five) minutes x 3 doses as needed for chest pain.    . Probiotic Product (ALIGN PO) Take 1 tablet by mouth daily as needed.    . TIADYLT ER 240 MG 24 hr capsule TAKE 1 CAPSULE BY MOUTH EVERY DAY 90 capsule 3  . torsemide (DEMADEX) 20 MG tablet Take 0.5 tablets (10 mg total) by mouth every other day. (Patient taking differently: Take 10 mg by mouth daily. Take  0.5 tablet ( 5 mg ) every morning) 22.5 tablet 3  . Vitamin D, Ergocalciferol, (DRISDOL) 50000 units CAPS capsule Take 50,000 Units by mouth every 7 (seven) days.      No current facility-administered medications on file prior to visit.    Allergies  Allergen Reactions  . Azithromycin Other (See Comments)    "flu like symptoms"  . Peanut Oil   . Penicillins   . Cortisone Other (See Comments)    "just cant tolerate it"    Objective: Physical Exam  General: Well developed, nourished, no acute distress, awake, alert and oriented x 3  Vascular: Dorsalis pedis artery 0/4 bilateral, Posterior tibial artery 1/4 faint bilateral, skin temperature warm to warm proximal to distal bilateral lower extremities, + varicosities,  +1 pitting edema bilateral ankles, Decreased pedal hair present bilateral. No acute signs of DVT no pain warmth or redness to  calf.   Neurological: Gross sensation present via light touch bilateral.  Subjective numbness to both feet, unchanged from prior.  Dermatological: Skin is warm, dry, and supple bilateral, Nails 1-10 are tender, long, thick, and discolored with moderate subungal debris, no webspace macerations present bilateral, no open lesions present bilateral, no callus/corns/hyperkeratotic tissue present bilateral. No signs of infection bilateral.  Musculoskeletal:  Hammertoe and bunion deformities noted bilateral. Muscular strength within normal limits without pain on range of motion. No pain with calf compression bilateral.  Assessment and  Plan:  Problem List Items Addressed This Visit   None   Visit Diagnoses    Pain due to onychomycosis of toenails of both feet    -  Primary   PVD (peripheral vascular disease) (HCC)       Anticoagulant long-term use         -Examined patient.  -Re-Discussed treatment options for painful mycotic nails. -Mechanically debrided and reduced mycotic nails with sterile nail nipper and dremel nail file without incident.  -Continue with follow-up with cardiology and elevation to assist with edema control, -Patient to return in 3 months/as needed for follow up evaluation or sooner if symptoms worsen.  [name], DPM

## 2020-06-08 ENCOUNTER — Other Ambulatory Visit: Payer: Self-pay | Admitting: Cardiology

## 2020-06-29 DIAGNOSIS — H52203 Unspecified astigmatism, bilateral: Secondary | ICD-10-CM | POA: Diagnosis not present

## 2020-06-29 DIAGNOSIS — Z961 Presence of intraocular lens: Secondary | ICD-10-CM | POA: Diagnosis not present

## 2020-06-29 DIAGNOSIS — H353131 Nonexudative age-related macular degeneration, bilateral, early dry stage: Secondary | ICD-10-CM | POA: Diagnosis not present

## 2020-06-29 DIAGNOSIS — H40013 Open angle with borderline findings, low risk, bilateral: Secondary | ICD-10-CM | POA: Diagnosis not present

## 2020-08-20 ENCOUNTER — Other Ambulatory Visit: Payer: Self-pay

## 2020-08-20 ENCOUNTER — Ambulatory Visit (INDEPENDENT_AMBULATORY_CARE_PROVIDER_SITE_OTHER): Payer: Medicare Other | Admitting: Cardiology

## 2020-08-20 ENCOUNTER — Encounter: Payer: Self-pay | Admitting: Cardiology

## 2020-08-20 VITALS — BP 120/70 | HR 79 | Ht 66.0 in | Wt 106.6 lb

## 2020-08-20 DIAGNOSIS — I5032 Chronic diastolic (congestive) heart failure: Secondary | ICD-10-CM | POA: Diagnosis not present

## 2020-08-20 DIAGNOSIS — I110 Hypertensive heart disease with heart failure: Secondary | ICD-10-CM | POA: Diagnosis not present

## 2020-08-20 DIAGNOSIS — I482 Chronic atrial fibrillation, unspecified: Secondary | ICD-10-CM | POA: Diagnosis not present

## 2020-08-20 DIAGNOSIS — Z7901 Long term (current) use of anticoagulants: Secondary | ICD-10-CM | POA: Diagnosis not present

## 2020-08-20 DIAGNOSIS — R296 Repeated falls: Secondary | ICD-10-CM | POA: Diagnosis not present

## 2020-08-20 MED ORDER — METOPROLOL TARTRATE 25 MG PO TABS: 12.5000 mg | ORAL_TABLET | Freq: Two times a day (BID) | ORAL | 3 refills | Status: DC

## 2020-08-20 NOTE — Patient Instructions (Signed)
Medication Instructions:  Your physician has recommended you make the following change in your medication:  DECREASE: Lopressor 12.5 mg take 1/2 tablet by mouth twice daily.  *If you need a refill on your cardiac medications before your next appointment, please call your pharmacy*   Lab Work: None If you have labs (blood work) drawn today and your tests are completely normal, you will receive your results only by: Marland Kitchen MyChart Message (if you have MyChart) OR . A paper copy in the mail If you have any lab test that is abnormal or we need to change your treatment, we will call you to review the results.   Testing/Procedures: None   Follow-Up: At [facility], you and your health needs are our priority.  As part of our continuing mission to provide you with exceptional heart care, we have created designated Provider Care Teams.  These Care Teams include your primary Cardiologist (physician) and Advanced Practice Providers (APPs -  Physician Assistants and Nurse Practitioners) who all work together to provide you with the care you need, when you need it.  We recommend signing up for the patient portal called "MyChart".  Sign up information is provided on this After Visit Summary.  MyChart is used to connect with patients for Virtual Visits (Telemedicine).  Patients are able to view lab/test results, encounter notes, upcoming appointments, etc.  Non-urgent messages can be sent to your provider as well.   To learn more about what you can do with MyChart, go to [email or web address].    Your next appointment:   6 month(s)  The format for your next appointment:   In Person  Provider:   Norman Herrlich, MD   Other Instructions

## 2020-08-20 NOTE — Progress Notes (Signed)
Cardiology Office Note:    Date:  08/20/2020   ID:  Amber Cline, DOB 1926-10-10, MRN [id number]  PCP:  Eloisa Northern, MD  Cardiologist:  Norman Herrlich, MD    Referring MD: Eloisa Northern, MD    ASSESSMENT:    1. Chronic atrial fibrillation (HCC)   2. Long term current use of anticoagulant   3. Hypertensive heart disease with chronic diastolic congestive heart failure (HCC)   4. Frequent falls    PLAN:    In order of problems listed above:  1. Stable on general principles we will reduce her beta-blocker and continue her calcium channel and reduced dose anticoagulant 2. Stable BP at target no edema Continue her diuretic beta-blocker and ARB 3. Improved no further episodes   Next appointment: 6 months   Medication Adjustments/Labs and Tests Ordered: Current medicines are reviewed at length with the patient today.  Concerns regarding medicines are outlined above.  Orders Placed This Encounter  Procedures  . EKG 12-Lead   Meds ordered this encounter  Medications  . metoprolol tartrate (LOPRESSOR) 25 MG tablet    Sig: Take 0.5 tablets (12.5 mg total) by mouth 2 (two) times daily.    Dispense:  90 tablet    Refill:  3    Chief Complaint  Patient presents with  . Follow-up    History of Present Illness:    Amber Cline is a 87 y.o. female with a hx of chronic atrial fibrillation with anticoagulation hypertension angina with normal coronary arteriography last seen 01/08/2020 with increasing frailty and falls.  After that visit she utilized a event monitor showing well controlled atrial fibrillation and no bradycardia.  Compliance with diet, lifestyle and medications: Yes  Fortunately she is doing better no further falls and she uses a walker for stability No recurrent syncope she she is quite thin but her weight is stable.  She is frail No palpitations syncope chest pain shortness of breath.  Past Medical History:  Diagnosis Date  . Angina pectoris (HCC) 06/06/2015    Overview:  With normal coronary arteriography 2006  Overview:  With normal coronary arteriography 2006  . Chronic atrial fibrillation (HCC) 06/06/2015   Overview:  CHADS2 vasc score= 6  Overview:  CHADS2 vasc score= 6  . Frequent falls 06/23/2019  . HCD (hypertensive cardiovascular disease) 06/06/2015  . Hypertensive heart disease 06/06/2015  . Hypothyroidism (acquired) 12/06/2017  . Long term current use of anticoagulant 06/06/2015    Past Surgical History:  Procedure Laterality Date  . CHOLECYSTECTOMY    . HAND SURGERY    . LITHOTRIPSY      Current Medications: Current Meds  Medication Sig  . apixaban (ELIQUIS) 2.5 MG TABS tablet Take 1 tablet (2.5 mg total) by mouth 2 (two) times daily.  Marland Kitchen apixaban (ELIQUIS) 2.5 MG TABS tablet Take 2.5 mg by mouth daily.  Marland Kitchen Apoaequorin (PREVAGEN PO) Take 1 tablet by mouth daily.   Marland Kitchen aspirin EC 81 MG tablet Take 81 mg by mouth daily. Swallow whole.  . candesartan (ATACAND) 32 MG tablet TAKE 1 TABLET (32 MG TOTAL) BY MOUTH DAILY.  . clindamycin (CLEOCIN) 150 MG capsule TAKE ALL 4 CAPSULES 1 HOUR BEFORE ANY DENTAL APPOINTMENT  . Coenzyme Q10 (CO Q 10 PO) Take 100 mg by mouth daily.  . Glucosamine-Chondroit-Vit C-Mn (GLUCOSAMINE CHONDR 1500 COMPLX) CAPS Take 1 capsule by mouth daily.  Marland Kitchen levothyroxine (SYNTHROID) 125 MCG tablet Take 125 mcg by mouth daily.  . metoprolol tartrate (LOPRESSOR) 25 MG tablet  Take 0.5 tablets (12.5 mg total) by mouth 2 (two) times daily.  . Multiple Vitamins-Minerals (CENTRUM SILVER PO) Take 1 tablet by mouth daily.  . nitroGLYCERIN (NITROSTAT) 0.4 MG SL tablet Place 0.4 mg under the tongue every 5 (five) minutes x 3 doses as needed for chest pain.  . Probiotic Product (ALIGN PO) Take 1 tablet by mouth daily as needed.  Chelsea Aus ER 240 MG 24 hr capsule TAKE 1 CAPSULE BY MOUTH EVERY DAY  . torsemide (DEMADEX) 20 MG tablet Take 10 mg by mouth daily.  . Vitamin D, Ergocalciferol, (DRISDOL) 50000 units CAPS capsule Take 50,000  Units by mouth every 7 (seven) days.   . [DISCONTINUED] metoprolol tartrate (LOPRESSOR) 25 MG tablet Take 25 mg by mouth. TAKE 1 TABLET 25 MG AM AND 0.5 TABLET ( 12.5 MG ) EVERY PM     Allergies:   Azithromycin, Peanut oil, Penicillins, and Cortisone   Social History   Socioeconomic History  . Marital status: Widowed    Spouse name: Not on file  . Number of children: Not on file  . Years of education: Not on file  . Highest education level: Not on file  Occupational History  . Not on file  Tobacco Use  . Smoking status: Never Smoker  . Smokeless tobacco: Never Used  Vaping Use  . Vaping Use: Never used  Substance and Sexual Activity  . Alcohol use: No  . Drug use: No  . Sexual activity: Not on file  Other Topics Concern  . Not on file  Social History Narrative  . Not on file   Social Determinants of Health   Financial Resource Strain: Not on file  Food Insecurity: Not on file  Transportation Needs: Not on file  Physical Activity: Not on file  Stress: Not on file  Social Connections: Not on file     Family History: The patient's family history includes Deafness in her mother; Heart disease in her brother, father, and paternal grandfather. ROS:   Please see the history of present illness.    All other systems reviewed and are negative.  EKGs/Labs/Other Studies Reviewed:    The following studies were reviewed today:  EKG:  EKG ordered today and personally reviewed.  The ekg ordered today demonstrates atrial fibrillation rate 79 bpm  Recent Labs: 01/08/2020: BUN 23; Creatinine, Ser 1.05; Hemoglobin 12.6; Platelets 176; Potassium 4.5; Sodium 140  Recent Lipid Panel No results found for: CHOL, TRIG, HDL, CHOLHDL, VLDL, LDLCALC, LDLDIRECT  Physical Exam:    VS:  BP 120/70 (BP Location: Right Arm, Patient Position: Sitting, Cuff Size: Normal)   Pulse 79   Ht 5\' 6"  (1.676 m)   Wt 106 lb 9.6 oz (48.4 kg)   SpO2 98%   BMI 17.21 kg/m     Wt Readings from Last 3  Encounters:  08/20/20 106 lb 9.6 oz (48.4 kg)  01/08/20 104 lb 9.6 oz (47.4 kg)  10/06/19 104 lb (47.2 kg)     GEN:  Well nourished, well developed in no acute distress HEENT: Normal NECK: No JVD; No carotid bruits LYMPHATICS: No lymphadenopathy CARDIAC: Irregular rhythm variable first heart sound  no murmurs, rubs, gallops RESPIRATORY:  Clear to auscultation without rales, wheezing or rhonchi  ABDOMEN: Soft, non-tender, non-distended MUSCULOSKELETAL:  No edema; No deformity  SKIN: Warm and dry NEUROLOGIC:  Alert and oriented x 3 PSYCHIATRIC:  Normal affect    Signed, 10/08/19, MD  08/20/2020 3:05 PM    [facility]

## 2020-08-23 DIAGNOSIS — E039 Hypothyroidism, unspecified: Secondary | ICD-10-CM | POA: Diagnosis not present

## 2020-08-23 DIAGNOSIS — I10 Essential (primary) hypertension: Secondary | ICD-10-CM | POA: Diagnosis not present

## 2020-08-23 DIAGNOSIS — I4821 Permanent atrial fibrillation: Secondary | ICD-10-CM | POA: Diagnosis not present

## 2020-08-31 ENCOUNTER — Other Ambulatory Visit: Payer: Self-pay

## 2020-08-31 ENCOUNTER — Encounter: Payer: Self-pay | Admitting: Sports Medicine

## 2020-08-31 ENCOUNTER — Ambulatory Visit (INDEPENDENT_AMBULATORY_CARE_PROVIDER_SITE_OTHER): Payer: Medicare Other | Admitting: Sports Medicine

## 2020-08-31 DIAGNOSIS — M79674 Pain in right toe(s): Secondary | ICD-10-CM | POA: Diagnosis not present

## 2020-08-31 DIAGNOSIS — Z7901 Long term (current) use of anticoagulants: Secondary | ICD-10-CM

## 2020-08-31 DIAGNOSIS — I739 Peripheral vascular disease, unspecified: Secondary | ICD-10-CM | POA: Diagnosis not present

## 2020-08-31 DIAGNOSIS — M79675 Pain in left toe(s): Secondary | ICD-10-CM

## 2020-08-31 DIAGNOSIS — B351 Tinea unguium: Secondary | ICD-10-CM | POA: Diagnosis not present

## 2020-08-31 NOTE — Progress Notes (Signed)
Patient ID: Amber Cline, female   DOB: 04/14/1926, 87 y.o.   MRN: [id number] Subjective: Amber Cline is a 87 y.o. female patient seen today in office with complaint of painful thickened and elongated toenails; unable to trim. Reports that she feels not so good some days she has good days and other days that they are bad.  Patient still on Lasix for swelling and still on Eliquis.  Patient reports that her heart doctor does not do much besides look at her ankles and squeezes them.  No other pedal complaints at this time.  Patient Active Problem List   Diagnosis Date Noted   Frequent falls 06/23/2019   Hypothyroidism (acquired) 12/06/2017   Angina pectoris (HCC) 06/06/2015   Long term current use of anticoagulant 06/06/2015   Chronic atrial fibrillation (HCC) 06/06/2015   Hypertensive heart disease 06/06/2015   HCD (hypertensive cardiovascular disease) 06/06/2015    Current Outpatient Medications on File Prior to Visit  Medication Sig Dispense Refill   apixaban (ELIQUIS) 2.5 MG TABS tablet Take 1 tablet (2.5 mg total) by mouth 2 (two) times daily. 180 tablet 3   apixaban (ELIQUIS) 2.5 MG TABS tablet Take 2.5 mg by mouth daily.     Apoaequorin (PREVAGEN PO) Take 1 tablet by mouth daily.      aspirin EC 81 MG tablet Take 81 mg by mouth daily. Swallow whole.     candesartan (ATACAND) 32 MG tablet TAKE 1 TABLET (32 MG TOTAL) BY MOUTH DAILY. 90 tablet 3   clindamycin (CLEOCIN) 150 MG capsule TAKE ALL 4 CAPSULES 1 HOUR BEFORE ANY DENTAL APPOINTMENT  12   Coenzyme Q10 (CO Q 10 PO) Take 100 mg by mouth daily.     Glucosamine-Chondroit-Vit C-Mn (GLUCOSAMINE CHONDR 1500 COMPLX) CAPS Take 1 capsule by mouth daily.     levothyroxine (SYNTHROID) 125 MCG tablet Take 125 mcg by mouth daily.     metoprolol tartrate (LOPRESSOR) 25 MG tablet Take 0.5 tablets (12.5 mg total) by mouth 2 (two) times daily. 90 tablet 3   Multiple Vitamins-Minerals (CENTRUM SILVER PO) Take 1 tablet by mouth daily.      nitroGLYCERIN (NITROSTAT) 0.4 MG SL tablet Place 0.4 mg under the tongue every 5 (five) minutes x 3 doses as needed for chest pain.     Probiotic Product (ALIGN PO) Take 1 tablet by mouth daily as needed.     TIADYLT ER 240 MG 24 hr capsule TAKE 1 CAPSULE BY MOUTH EVERY DAY 90 capsule 3   torsemide (DEMADEX) 20 MG tablet Take 10 mg by mouth daily.     Vitamin D, Ergocalciferol, (DRISDOL) 50000 units CAPS capsule Take 50,000 Units by mouth every 7 (seven) days.      No current facility-administered medications on file prior to visit.    Allergies  Allergen Reactions   Azithromycin Other (See Comments)    "flu like symptoms"   Peanut Oil    Penicillins    Cortisone Other (See Comments)    "just cant tolerate it"    Objective: Physical Exam  General: Well developed, nourished, no acute distress, awake, alert and oriented x 3  Vascular: Dorsalis pedis artery 0/4 bilateral, Posterior tibial artery 1/4 faint bilateral, skin temperature warm to warm proximal to distal bilateral lower extremities, + varicosities,  +1 pitting edema bilateral ankles, Decreased pedal hair present bilateral. No acute signs of DVT no pain warmth or redness to calf.   Neurological: Gross sensation present via light touch bilateral.  Subjective numbness to  both feet, unchanged from prior.  Dermatological: Skin is warm, dry, and supple bilateral, Nails 1-10 are tender, long, thick, and discolored with moderate subungal debris, no webspace macerations present bilateral, no open lesions present bilateral, no callus/corns/hyperkeratotic tissue present bilateral. No signs of infection bilateral.  Musculoskeletal:  Hammertoe and bunion deformities noted bilateral. Muscular strength within normal limits without pain on range of motion. No pain with calf compression bilateral.  Assessment and Plan:  Problem List Items Addressed This Visit   None Visit Diagnoses     Pain due to onychomycosis of toenails of both feet     -  Primary   PVD (peripheral vascular disease) (HCC)       Anticoagulant long-term use          -Examined patient.  -Re-Discussed treatment options for painful mycotic nails. -Mechanically debrided and reduced mycotic nails with sterile nail nipper and dremel nail file without incident.  -Continue with follow-up with cardiology and elevation to assist with edema control as previously recommended -Patient to return in 3 months/as needed for follow up evaluation or sooner if symptoms worsen.  [name], DPM

## 2020-11-29 DIAGNOSIS — E039 Hypothyroidism, unspecified: Secondary | ICD-10-CM | POA: Diagnosis not present

## 2020-11-29 DIAGNOSIS — Z23 Encounter for immunization: Secondary | ICD-10-CM | POA: Diagnosis not present

## 2020-11-29 DIAGNOSIS — I10 Essential (primary) hypertension: Secondary | ICD-10-CM | POA: Diagnosis not present

## 2020-11-29 DIAGNOSIS — I4821 Permanent atrial fibrillation: Secondary | ICD-10-CM | POA: Diagnosis not present

## 2020-12-01 ENCOUNTER — Other Ambulatory Visit: Payer: Self-pay

## 2020-12-01 ENCOUNTER — Ambulatory Visit (INDEPENDENT_AMBULATORY_CARE_PROVIDER_SITE_OTHER): Payer: Medicare Other | Admitting: Sports Medicine

## 2020-12-01 ENCOUNTER — Encounter: Payer: Self-pay | Admitting: Sports Medicine

## 2020-12-01 DIAGNOSIS — M79675 Pain in left toe(s): Secondary | ICD-10-CM

## 2020-12-01 DIAGNOSIS — B351 Tinea unguium: Secondary | ICD-10-CM

## 2020-12-01 DIAGNOSIS — Z7901 Long term (current) use of anticoagulants: Secondary | ICD-10-CM

## 2020-12-01 DIAGNOSIS — M79674 Pain in right toe(s): Secondary | ICD-10-CM

## 2020-12-01 DIAGNOSIS — I739 Peripheral vascular disease, unspecified: Secondary | ICD-10-CM

## 2020-12-01 NOTE — Progress Notes (Signed)
Patient ID: Amber Cline, female   DOB: [birth year], 87 y.o.   MRN: [id number] Subjective: Amber Cline is a 87 y.o. female patient seen today in office with complaint of painful thickened and elongated toenails; unable to trim. Reports that she has some swelling today and some numbness but otherwise doing okay.  Patient Active Problem List   Diagnosis Date Noted   Frequent falls 06/23/2019   Hypothyroidism (acquired) 12/06/2017   Angina pectoris (HCC) 06/06/2015   Long term current use of anticoagulant 06/06/2015   Chronic atrial fibrillation (HCC) 06/06/2015   Hypertensive heart disease 06/06/2015   HCD (hypertensive cardiovascular disease) 06/06/2015    Current Outpatient Medications on File Prior to Visit  Medication Sig Dispense Refill   apixaban (ELIQUIS) 2.5 MG TABS tablet Take 1 tablet (2.5 mg total) by mouth 2 (two) times daily. 180 tablet 3   apixaban (ELIQUIS) 2.5 MG TABS tablet Take 2.5 mg by mouth daily.     Apoaequorin (PREVAGEN PO) Take 1 tablet by mouth daily.      aspirin EC 81 MG tablet Take 81 mg by mouth daily. Swallow whole.     candesartan (ATACAND) 32 MG tablet TAKE 1 TABLET (32 MG TOTAL) BY MOUTH DAILY. 90 tablet 3   clindamycin (CLEOCIN) 150 MG capsule TAKE ALL 4 CAPSULES 1 HOUR BEFORE ANY DENTAL APPOINTMENT  12   Coenzyme Q10 (CO Q 10 PO) Take 100 mg by mouth daily.     Glucosamine-Chondroit-Vit C-Mn (GLUCOSAMINE CHONDR 1500 COMPLX) CAPS Take 1 capsule by mouth daily.     levothyroxine (SYNTHROID) 125 MCG tablet Take 125 mcg by mouth daily.     metoprolol tartrate (LOPRESSOR) 25 MG tablet Take 0.5 tablets (12.5 mg total) by mouth 2 (two) times daily. 90 tablet 3   Multiple Vitamins-Minerals (CENTRUM SILVER PO) Take 1 tablet by mouth daily.     nitroGLYCERIN (NITROSTAT) 0.4 MG SL tablet Place 0.4 mg under the tongue every 5 (five) minutes x 3 doses as needed for chest pain.     Probiotic Product (ALIGN PO) Take 1 tablet by mouth daily as needed.     TIADYLT  ER 240 MG 24 hr capsule TAKE 1 CAPSULE BY MOUTH EVERY DAY 90 capsule 3   torsemide (DEMADEX) 20 MG tablet Take 10 mg by mouth daily.     Vitamin D, Ergocalciferol, (DRISDOL) 50000 units CAPS capsule Take 50,000 Units by mouth every 7 (seven) days.      No current facility-administered medications on file prior to visit.    Allergies  Allergen Reactions   Azithromycin Other (See Comments)    "flu like symptoms"   Peanut Oil    Penicillins    Cortisone Other (See Comments)    "just cant tolerate it"    Objective: Physical Exam  General: Well developed, nourished, no acute distress, awake, alert and oriented x 3  Vascular: Dorsalis pedis artery 0/4 bilateral, Posterior tibial artery 1/4 faint bilateral, skin temperature warm to warm proximal to distal bilateral lower extremities, + varicosities,  +1 pitting edema bilateral ankles, Decreased pedal hair present bilateral. No acute signs of DVT no pain warmth or redness to calf.   Neurological: Gross sensation present via light touch bilateral.  Subjective numbness to both feet, unchanged from prior.  Dermatological: Skin is warm, dry, and supple bilateral, Nails 1-10 are tender, long, thick, and discolored with moderate subungal debris, no webspace macerations present bilateral, no open lesions present bilateral, no callus/corns/hyperkeratotic tissue present bilateral. No signs of  infection bilateral.  Musculoskeletal:  Hammertoe and bunion deformities noted bilateral. Muscular strength within normal limits without pain on range of motion. No pain with calf compression bilateral.  Assessment and Plan:  Problem List Items Addressed This Visit   None Visit Diagnoses     Pain due to onychomycosis of toenails of both feet    -  Primary   PVD (peripheral vascular disease) (HCC)       Anticoagulant long-term use          -Examined patient.  -Re-Discussed treatment options for painful mycotic nails. -Mechanically debrided and reduced  mycotic nails with sterile nail nipper and dremel nail file without incident.  -Right third toe unable to be trimmed due to history of pain to toe -Continue with follow-up with cardiology and elevation to assist with edema control as previously recommended -Patient to return in 3 months/as needed for follow up evaluation or sooner if symptoms worsen.  [name], DPM

## 2021-01-04 ENCOUNTER — Other Ambulatory Visit: Payer: Self-pay | Admitting: Cardiology

## 2021-02-16 ENCOUNTER — Other Ambulatory Visit: Payer: Self-pay

## 2021-02-16 ENCOUNTER — Encounter: Payer: Self-pay | Admitting: Cardiology

## 2021-02-16 ENCOUNTER — Ambulatory Visit (INDEPENDENT_AMBULATORY_CARE_PROVIDER_SITE_OTHER): Payer: Medicare Other | Admitting: Cardiology

## 2021-02-16 VITALS — BP 174/80 | HR 72 | Ht 66.0 in | Wt 106.0 lb

## 2021-02-16 DIAGNOSIS — I482 Chronic atrial fibrillation, unspecified: Secondary | ICD-10-CM

## 2021-02-16 DIAGNOSIS — I110 Hypertensive heart disease with heart failure: Secondary | ICD-10-CM

## 2021-02-16 DIAGNOSIS — R296 Repeated falls: Secondary | ICD-10-CM

## 2021-02-16 DIAGNOSIS — I5032 Chronic diastolic (congestive) heart failure: Secondary | ICD-10-CM | POA: Diagnosis not present

## 2021-02-16 DIAGNOSIS — Z7901 Long term (current) use of anticoagulants: Secondary | ICD-10-CM | POA: Diagnosis not present

## 2021-02-16 DIAGNOSIS — I209 Angina pectoris, unspecified: Secondary | ICD-10-CM | POA: Diagnosis not present

## 2021-02-16 NOTE — Patient Instructions (Signed)

## 2021-02-16 NOTE — Progress Notes (Signed)
Cardiology Office Note:    Date:  02/16/2021   ID:  Amber Cline, DOB [birth year], MRN [id number]  PCP:  Eloisa Northern, MD  Cardiologist:  Norman Herrlich, MD    Referring MD: Eloisa Northern, MD    ASSESSMENT:    1. Chronic atrial fibrillation (HCC)   2. Long term current use of anticoagulant   3. Hypertensive heart disease with chronic diastolic congestive heart failure (HCC)   4. Frequent falls   5. Angina pectoris (HCC)    PLAN:    In order of problems listed above:  My perspective she has done well her atrial fibrillation is rate controlled she has had no further falls or syncope and in general plans ago we reduced her calcium channel blocker and beta-blocker dosage and her rate is well controlled.  She will continue her current low dose anticoagulant with age and body mass. Stable continue her current medical dose of diuretic and antihypertensives I asked her son to trend her blood pressure at home and uncomfortable intensifying her antihypertensives with age and frequent falls. Stable angina no recurrence   Next appointment: 6 months   Medication Adjustments/Labs and Tests Ordered: Current medicines are reviewed at length with the patient today.  Concerns regarding medicines are outlined above.  No orders of the defined types were placed in this encounter.  No orders of the defined types were placed in this encounter.   Chief Complaint  Patient presents with   Follow-up   Atrial Fibrillation    History of Present Illness:    Amber Cline is a 87 y.o. female with a hx of chronic atrial fibrillation with anticoagulation hypertension angina with normal coronary arteriography and frailty with frequent falls last seen 08/20/2020.  Compliance with diet, lifestyle and medications: Yes  For cardiology perspective she is doing well she has had no further falls no palpitation shortness of breath chest pain no syncope which she has a little bit of edema and she takes a  calcium channel blocker for rate control Unfortunately she has very poor appetite and continues to lose weight and is becoming increasingly frail. Her son is present and supervises care and takes care of his mother at home. She has back pain and I told her she can take 1 or 2 Tylenol midday along with her anticoagulant. She has had no bleeding with Eliquis. She has had no recurrent angina Past Medical History:  Diagnosis Date   Angina pectoris (HCC) 06/06/2015   Overview:  With normal coronary arteriography 2006  Overview:  With normal coronary arteriography 2006   Chronic atrial fibrillation (HCC) 06/06/2015   Overview:  CHADS2 vasc score= 6  Overview:  CHADS2 vasc score= 6   Frequent falls 06/23/2019   HCD (hypertensive cardiovascular disease) 06/06/2015   Hypertensive heart disease 06/06/2015   Hypothyroidism (acquired) 12/06/2017   Long term current use of anticoagulant 06/06/2015    Past Surgical History:  Procedure Laterality Date   CHOLECYSTECTOMY     HAND SURGERY     LITHOTRIPSY      Current Medications: Current Meds  Medication Sig   apixaban (ELIQUIS) 2.5 MG TABS tablet Take 2.5 mg by mouth daily.   Apoaequorin (PREVAGEN PO) Take 1 tablet by mouth daily.    aspirin EC 81 MG tablet Take 81 mg by mouth daily. Swallow whole.   candesartan (ATACAND) 32 MG tablet TAKE 1 TABLET (32 MG TOTAL) BY MOUTH DAILY.   clindamycin (CLEOCIN) 150 MG capsule TAKE ALL 4 CAPSULES 1  HOUR BEFORE ANY DENTAL APPOINTMENT   Coenzyme Q10 (CO Q 10 PO) Take 100 mg by mouth daily.   Glucosamine-Chondroit-Vit C-Mn (GLUCOSAMINE CHONDR 1500 COMPLX) CAPS Take 1 capsule by mouth daily.   levothyroxine (SYNTHROID) 112 MCG tablet Take 112 mcg by mouth daily.   Multiple Vitamins-Minerals (CENTRUM SILVER PO) Take 1 tablet by mouth daily.   nitroGLYCERIN (NITROSTAT) 0.4 MG SL tablet Place 0.4 mg under the tongue every 5 (five) minutes x 3 doses as needed for chest pain.   Probiotic Product (ALIGN PO) Take 1 tablet  by mouth daily as needed.   TIADYLT ER 240 MG 24 hr capsule TAKE 1 CAPSULE BY MOUTH EVERY DAY   torsemide (DEMADEX) 20 MG tablet Take 10 mg by mouth daily.   Vitamin D, Ergocalciferol, (DRISDOL) 50000 units CAPS capsule Take 50,000 Units by mouth every 7 (seven) days.      Allergies:   Azithromycin, Peanut oil, Penicillins, and Cortisone   Social History   Socioeconomic History   Marital status: Widowed    Spouse name: Not on file   Number of children: Not on file   Years of education: Not on file   Highest education level: Not on file  Occupational History   Not on file  Tobacco Use   Smoking status: Never   Smokeless tobacco: Never  Vaping Use   Vaping Use: Never used  Substance and Sexual Activity   Alcohol use: No   Drug use: No   Sexual activity: Not on file  Other Topics Concern   Not on file  Social History Narrative   Not on file   Social Determinants of Health   Financial Resource Strain: Not on file  Food Insecurity: Not on file  Transportation Needs: Not on file  Physical Activity: Not on file  Stress: Not on file  Social Connections: Not on file     Family History: The patient's family history includes Deafness in her mother; Heart disease in her brother, father, and paternal grandfather. ROS:   Please see the history of present illness.    All other systems reviewed and are negative.  EKGs/Labs/Other Studies Reviewed:    The following studies were reviewed today:    Recent Labs: \11/29/2020: Hemoglobin 12.6 creatinine 1.2 potassium 4.9 TSH normal 0.268  Physical Exam:    VS:  BP (!) 174/80   Pulse 72   Ht 5\' 6"  (1.676 m)   Wt 106 lb (48.1 kg)   SpO2 92%   BMI 17.11 kg/m     Wt Readings from Last 3 Encounters:  02/16/21 106 lb (48.1 kg)  08/20/20 106 lb 9.6 oz (48.4 kg)  01/08/20 104 lb 9.6 oz (47.4 kg)     GEN: Very thin she looks quite frail well nourished, well developed in no acute distress HEENT: Normal NECK: No JVD; No  carotid bruits LYMPHATICS: No lymphadenopathy CARDIAC: Irregular rate and rhythm RRR, no murmurs, rubs, gallops RESPIRATORY:  Clear to auscultation without rales, wheezing or rhonchi  ABDOMEN: Soft, non-tender, non-distended MUSCULOSKELETAL:  No edema; No deformity  SKIN: Warm and dry NEUROLOGIC:  Alert and oriented x 3 PSYCHIATRIC:  Normal affect    Signed, 01/10/20, MD  02/16/2021 2:48 PM    [facility]

## 2021-02-28 DIAGNOSIS — I4821 Permanent atrial fibrillation: Secondary | ICD-10-CM | POA: Diagnosis not present

## 2021-02-28 DIAGNOSIS — N1832 Chronic kidney disease, stage 3b: Secondary | ICD-10-CM | POA: Diagnosis not present

## 2021-02-28 DIAGNOSIS — I10 Essential (primary) hypertension: Secondary | ICD-10-CM | POA: Diagnosis not present

## 2021-02-28 DIAGNOSIS — E039 Hypothyroidism, unspecified: Secondary | ICD-10-CM | POA: Diagnosis not present

## 2021-03-04 ENCOUNTER — Ambulatory Visit (INDEPENDENT_AMBULATORY_CARE_PROVIDER_SITE_OTHER): Payer: Medicare Other | Admitting: Sports Medicine

## 2021-03-04 ENCOUNTER — Encounter: Payer: Self-pay | Admitting: Sports Medicine

## 2021-03-04 DIAGNOSIS — M79674 Pain in right toe(s): Secondary | ICD-10-CM | POA: Diagnosis not present

## 2021-03-04 DIAGNOSIS — Z7901 Long term (current) use of anticoagulants: Secondary | ICD-10-CM

## 2021-03-04 DIAGNOSIS — B351 Tinea unguium: Secondary | ICD-10-CM | POA: Diagnosis not present

## 2021-03-04 DIAGNOSIS — M79675 Pain in left toe(s): Secondary | ICD-10-CM | POA: Diagnosis not present

## 2021-03-04 DIAGNOSIS — I739 Peripheral vascular disease, unspecified: Secondary | ICD-10-CM

## 2021-03-04 NOTE — Progress Notes (Signed)
Patient ID: Amber Cline, female   DOB: Oct 01, 1926, 87 y.o.   MRN: [id number] Subjective: Amber Cline is a 87 y.o. female patient seen today in office with complaint of painful thickened and elongated toenails; unable to trim. Reports that she is doing okay denies any other pedal complaints at this time.  Patient Active Problem List   Diagnosis Date Noted   Frequent falls 06/23/2019   Hypothyroidism (acquired) 12/06/2017   Angina pectoris (HCC) 06/06/2015   Long term current use of anticoagulant 06/06/2015   Chronic atrial fibrillation (HCC) 06/06/2015   Hypertensive heart disease 06/06/2015   HCD (hypertensive cardiovascular disease) 06/06/2015    Current Outpatient Medications on File Prior to Visit  Medication Sig Dispense Refill   apixaban (ELIQUIS) 2.5 MG TABS tablet Take 2.5 mg by mouth daily.     Apoaequorin (PREVAGEN PO) Take 1 tablet by mouth daily.      aspirin EC 81 MG tablet Take 81 mg by mouth daily. Swallow whole.     candesartan (ATACAND) 32 MG tablet TAKE 1 TABLET (32 MG TOTAL) BY MOUTH DAILY. 90 tablet 1   clindamycin (CLEOCIN) 150 MG capsule TAKE ALL 4 CAPSULES 1 HOUR BEFORE ANY DENTAL APPOINTMENT  12   Coenzyme Q10 (CO Q 10 PO) Take 100 mg by mouth daily.     Glucosamine-Chondroit-Vit C-Mn (GLUCOSAMINE CHONDR 1500 COMPLX) CAPS Take 1 capsule by mouth daily.     levothyroxine (SYNTHROID) 112 MCG tablet Take 112 mcg by mouth daily.     metoprolol tartrate (LOPRESSOR) 25 MG tablet Take 0.5 tablets (12.5 mg total) by mouth 2 (two) times daily. 90 tablet 3   Multiple Vitamins-Minerals (CENTRUM SILVER PO) Take 1 tablet by mouth daily.     nitroGLYCERIN (NITROSTAT) 0.4 MG SL tablet Place 0.4 mg under the tongue every 5 (five) minutes x 3 doses as needed for chest pain.     Probiotic Product (ALIGN PO) Take 1 tablet by mouth daily as needed.     SYNTHROID 125 MCG tablet Take 125 mcg by mouth daily.     TIADYLT ER 240 MG 24 hr capsule TAKE 1 CAPSULE BY MOUTH EVERY DAY  90 capsule 1   torsemide (DEMADEX) 20 MG tablet Take 10 mg by mouth daily.     Vitamin D, Ergocalciferol, (DRISDOL) 50000 units CAPS capsule Take 50,000 Units by mouth every 7 (seven) days.      No current facility-administered medications on file prior to visit.    Allergies  Allergen Reactions   Azithromycin Other (See Comments)    "flu like symptoms"   Peanut Oil    Penicillins    Cortisone Other (See Comments)    "just cant tolerate it"    Objective: Physical Exam  General: Well developed, nourished, no acute distress, awake, alert and oriented x 3  Vascular: Dorsalis pedis artery 0/4 bilateral, Posterior tibial artery 1/4 faint bilateral, skin temperature warm to warm proximal to distal bilateral lower extremities, + varicosities,  +1 pitting edema bilateral ankles, Decreased pedal hair present bilateral. No acute signs of DVT no pain warmth or redness to calf.   Neurological: Gross sensation present via light touch bilateral.  Subjective numbness to both feet, unchanged from prior.  Dermatological: Skin is warm, dry, and supple bilateral, Nails 1-10 are tender, long, thick, and discolored with moderate subungal debris, no webspace macerations present bilateral, no open lesions present bilateral, no callus/corns/hyperkeratotic tissue present bilateral. No signs of infection bilateral.  Musculoskeletal:  Hammertoe and bunion deformities  noted bilateral. Muscular strength within normal limits without pain on range of motion. No pain with calf compression bilateral.  Assessment and Plan:  Problem List Items Addressed This Visit   None Visit Diagnoses     Pain due to onychomycosis of toenails of both feet    -  Primary   PVD (peripheral vascular disease) (HCC)       Anticoagulant long-term use          -Examined patient.  -Re-Discussed treatment options for painful mycotic nails. -Mechanically debrided and reduced mycotic nails with sterile nail nipper and dremel nail file  without incident. -Continue with follow-up with cardiology and elevation to assist with edema control like before -Patient to return in 3 months/as needed for follow up evaluation or sooner if symptoms worsen.  [name], DPM

## 2021-03-05 ENCOUNTER — Other Ambulatory Visit: Payer: Self-pay | Admitting: Sports Medicine

## 2021-03-05 NOTE — Progress Notes (Signed)
error 

## 2021-04-26 ENCOUNTER — Other Ambulatory Visit: Payer: Self-pay | Admitting: Cardiology

## 2021-04-26 NOTE — Telephone Encounter (Signed)
Prescription refill request for Eliquis received. Indication: afib  Last office visit: [place], 02/16/2021 Scr: 1.45 02/28/2021 Age: 88  Weight: 48.1 kg

## 2021-05-30 DIAGNOSIS — I10 Essential (primary) hypertension: Secondary | ICD-10-CM | POA: Diagnosis not present

## 2021-05-30 DIAGNOSIS — E039 Hypothyroidism, unspecified: Secondary | ICD-10-CM | POA: Diagnosis not present

## 2021-05-30 DIAGNOSIS — N1832 Chronic kidney disease, stage 3b: Secondary | ICD-10-CM | POA: Diagnosis not present

## 2021-05-30 DIAGNOSIS — I480 Paroxysmal atrial fibrillation: Secondary | ICD-10-CM | POA: Diagnosis not present

## 2021-05-30 DIAGNOSIS — R627 Adult failure to thrive: Secondary | ICD-10-CM | POA: Diagnosis not present

## 2021-06-01 ENCOUNTER — Other Ambulatory Visit: Payer: Self-pay

## 2021-06-01 ENCOUNTER — Other Ambulatory Visit: Payer: Self-pay | Admitting: Cardiology

## 2021-06-01 MED ORDER — APIXABAN 2.5 MG PO TABS: 2.5000 mg | ORAL_TABLET | Freq: Every day | ORAL | 3 refills | Status: DC

## 2021-06-06 ENCOUNTER — Other Ambulatory Visit: Payer: Self-pay

## 2021-06-06 MED ORDER — CANDESARTAN CILEXETIL 32 MG PO TABS: 32.0000 mg | ORAL_TABLET | Freq: Every day | ORAL | 1 refills | Status: DC

## 2021-06-06 MED ORDER — DILTIAZEM HCL ER BEADS 240 MG PO CP24: 240.0000 mg | ORAL_CAPSULE | Freq: Every day | ORAL | 1 refills | Status: DC

## 2021-06-07 ENCOUNTER — Other Ambulatory Visit: Payer: Self-pay

## 2021-06-07 ENCOUNTER — Ambulatory Visit (INDEPENDENT_AMBULATORY_CARE_PROVIDER_SITE_OTHER): Payer: Medicare Other | Admitting: Sports Medicine

## 2021-06-07 ENCOUNTER — Encounter: Payer: Self-pay | Admitting: Sports Medicine

## 2021-06-07 DIAGNOSIS — Z7901 Long term (current) use of anticoagulants: Secondary | ICD-10-CM

## 2021-06-07 DIAGNOSIS — M79674 Pain in right toe(s): Secondary | ICD-10-CM | POA: Diagnosis not present

## 2021-06-07 DIAGNOSIS — M79675 Pain in left toe(s): Secondary | ICD-10-CM

## 2021-06-07 DIAGNOSIS — B351 Tinea unguium: Secondary | ICD-10-CM | POA: Diagnosis not present

## 2021-06-07 DIAGNOSIS — I739 Peripheral vascular disease, unspecified: Secondary | ICD-10-CM

## 2021-06-07 NOTE — Progress Notes (Signed)
Patient ID: Amber Cline, female   DOB: Mar 03, 1927, 88 y.o.   MRN: [id number] ?Subjective: ?Amber Cline is a 88 y.o. female patient seen today in office with complaint of painful thickened and elongated toenails; unable to trim. Reports that she is doing okay with some occasional numbness to toes but denies any other pedal complaints at this time. ? ?Patient is assisted by son this visit. ? ?Patient Active Problem List  ? Diagnosis Date Noted  ? Frequent falls 06/23/2019  ? Hypothyroidism (acquired) 12/06/2017  ? Angina pectoris (HCC) 06/06/2015  ? Long term current use of anticoagulant 06/06/2015  ? Chronic atrial fibrillation (HCC) 06/06/2015  ? Hypertensive heart disease 06/06/2015  ? HCD (hypertensive cardiovascular disease) 06/06/2015  ? ? ?Current Outpatient Medications on File Prior to Visit  ?Medication Sig Dispense Refill  ? apixaban (ELIQUIS) 2.5 MG TABS tablet Take 1 tablet (2.5 mg total) by mouth daily. 90 tablet 3  ? Apoaequorin (PREVAGEN PO) Take 1 tablet by mouth daily.     ? aspirin EC 81 MG tablet Take 81 mg by mouth daily. Swallow whole.    ? candesartan (ATACAND) 32 MG tablet Take 1 tablet (32 mg total) by mouth daily. 90 tablet 1  ? clindamycin (CLEOCIN) 150 MG capsule TAKE ALL 4 CAPSULES 1 HOUR BEFORE ANY DENTAL APPOINTMENT  12  ? Coenzyme Q10 (CO Q 10 PO) Take 100 mg by mouth daily.    ? diltiazem (TIADYLT ER) 240 MG 24 hr capsule Take 1 capsule (240 mg total) by mouth daily. 90 capsule 1  ? Glucosamine-Chondroit-Vit C-Mn (GLUCOSAMINE CHONDR 1500 COMPLX) CAPS Take 1 capsule by mouth daily.    ? levothyroxine (SYNTHROID) 112 MCG tablet Take 112 mcg by mouth daily.    ? metoprolol tartrate (LOPRESSOR) 25 MG tablet Take 0.5 tablets (12.5 mg total) by mouth 2 (two) times daily. 90 tablet 3  ? Multiple Vitamins-Minerals (CENTRUM SILVER PO) Take 1 tablet by mouth daily.    ? nitroGLYCERIN (NITROSTAT) 0.4 MG SL tablet Place 0.4 mg under the tongue every 5 (five) minutes x 3 doses as needed for  chest pain.    ? Probiotic Product (ALIGN PO) Take 1 tablet by mouth daily as needed.    ? SYNTHROID 125 MCG tablet Take 125 mcg by mouth daily.    ? torsemide (DEMADEX) 20 MG tablet Take 10 mg by mouth daily.    ? Vitamin D, Ergocalciferol, (DRISDOL) 50000 units CAPS capsule Take 50,000 Units by mouth every 7 (seven) days.     ? ?No current facility-administered medications on file prior to visit.  ? ? ?Allergies  ?Allergen Reactions  ? Azithromycin Other (See Comments)  ?  "flu like symptoms"  ? Peanut Oil   ? Penicillins   ? Cortisone Other (See Comments)  ?  "just cant tolerate it"  ? ? ?Objective: ?Physical Exam ? ?General: Well developed, nourished, no acute distress, awake, alert and oriented x 3 ? ?Vascular: Dorsalis pedis artery 0/4 bilateral, Posterior tibial artery 1/4 faint bilateral, skin temperature warm to warm proximal to distal bilateral lower extremities, + varicosities,  +1 pitting edema bilateral ankles, Decreased pedal hair present bilateral. No acute signs of DVT no pain warmth or redness to calf.  ? ?Neurological: Gross sensation present via light touch bilateral.  Subjective numbness to both feet, unchanged from prior. ? ?Dermatological: Skin is warm, dry, and supple bilateral, Nails 1-10 are tender, long, thick, and discolored with moderate subungal debris, no webspace macerations present bilateral,  no open lesions present bilateral, no callus/corns/hyperkeratotic tissue present bilateral. No signs of infection bilateral. ? ?Musculoskeletal:  Hammertoe and bunion deformities noted bilateral. Muscular strength within normal limits without pain on range of motion. No pain with calf compression bilateral. ? ?Assessment and Plan:  ?Problem List Items Addressed This Visit   ?None ?Visit Diagnoses   ? ? Pain due to onychomycosis of toenails of both feet    -  Primary  ? PVD (peripheral vascular disease) (HCC)      ? Anticoagulant long-term use      ? ?  ? ?-Examined patient.  ?-Re-Discussed  treatment options for painful mycotic nails. ?-Mechanically debrided and reduced mycotic nails with sterile nail nipper and dremel nail file without incident. ?-Patient is followed closely by cardiology ?-Patient to return in 3 months for nail care with Dr. Ralene Cork or sooner if symptoms worsen. ? ?[name], DPM ? ?

## 2021-08-21 NOTE — Progress Notes (Deleted)
Cardiology Office Note:    Date:  08/21/2021   ID:  Amber Cline, DOB [birth year], MRN [id number]  PCP:  Eloisa Northern, MD  Cardiologist:  Norman Herrlich, MD    Referring MD: Eloisa Northern, MD    ASSESSMENT:    No diagnosis found. PLAN:    In order of problems listed above:  ***   Next appointment: ***   Medication Adjustments/Labs and Tests Ordered: Current medicines are reviewed at length with the patient today.  Concerns regarding medicines are outlined above.  No orders of the defined types were placed in this encounter.  No orders of the defined types were placed in this encounter.   No chief complaint on file.   History of Present Illness:    Amber Cline is a 88 y.o. female with a hx of chronic atrial fibrillation with long-term anticoagulation angina with normal coronary arteriography frailty and frequent falls last seen 02/16/2021. Compliance with diet, lifestyle and medications: *** Past Medical History:  Diagnosis Date   Angina pectoris (HCC) 06/06/2015   Overview:  With normal coronary arteriography 2006  Overview:  With normal coronary arteriography 2006   Chronic atrial fibrillation (HCC) 06/06/2015   Overview:  CHADS2 vasc score= 6  Overview:  CHADS2 vasc score= 6   Frequent falls 06/23/2019   HCD (hypertensive cardiovascular disease) 06/06/2015   Hypertensive heart disease 06/06/2015   Hypothyroidism (acquired) 12/06/2017   Long term current use of anticoagulant 06/06/2015    Past Surgical History:  Procedure Laterality Date   CHOLECYSTECTOMY     HAND SURGERY     LITHOTRIPSY      Current Medications: No outpatient medications have been marked as taking for the 08/23/21 encounter (Appointment) with Baldo Daub, MD.     Allergies:   Azithromycin, Peanut oil, Penicillins, and Cortisone   Social History   Socioeconomic History   Marital status: Widowed    Spouse name: Not on file   Number of children: Not on file   Years of education: Not on  file   Highest education level: Not on file  Occupational History   Not on file  Tobacco Use   Smoking status: Never   Smokeless tobacco: Never  Vaping Use   Vaping Use: Never used  Substance and Sexual Activity   Alcohol use: No   Drug use: No   Sexual activity: Not on file  Other Topics Concern   Not on file  Social History Narrative   Not on file   Social Determinants of Health   Financial Resource Strain: Not on file  Food Insecurity: Not on file  Transportation Needs: Not on file  Physical Activity: Not on file  Stress: Not on file  Social Connections: Not on file     Family History: The patient's ***family history includes Deafness in her mother; Heart disease in her brother, father, and paternal grandfather. ROS:   Please see the history of present illness.    All other systems reviewed and are negative.  EKGs/Labs/Other Studies Reviewed:    The following studies were reviewed today:  EKG:  EKG ordered today and personally reviewed.  The ekg ordered today demonstrates ***  Recent Labs: No results found for requested labs within last 8760 hours.  Recent Lipid Panel No results found for: CHOL, TRIG, HDL, CHOLHDL, VLDL, LDLCALC, LDLDIRECT  Physical Exam:    VS:  There were no vitals taken for this visit.    Wt Readings from Last 3 Encounters:  02/16/21  106 lb (48.1 kg)  08/20/20 106 lb 9.6 oz (48.4 kg)  01/08/20 104 lb 9.6 oz (47.4 kg)     GEN: *** Well nourished, well developed in no acute distress HEENT: Normal NECK: No JVD; No carotid bruits LYMPHATICS: No lymphadenopathy CARDIAC: ***RRR, no murmurs, rubs, gallops RESPIRATORY:  Clear to auscultation without rales, wheezing or rhonchi  ABDOMEN: Soft, non-tender, non-distended MUSCULOSKELETAL:  No edema; No deformity  SKIN: Warm and dry NEUROLOGIC:  Alert and oriented x 3 PSYCHIATRIC:  Normal affect    Signed, Norman Herrlich, MD  08/21/2021 2:49 PM    [facility]

## 2021-08-23 ENCOUNTER — Ambulatory Visit: Payer: Medicare Other | Admitting: Cardiology

## 2021-08-31 ENCOUNTER — Encounter: Payer: Self-pay | Admitting: Cardiology

## 2021-08-31 ENCOUNTER — Ambulatory Visit (INDEPENDENT_AMBULATORY_CARE_PROVIDER_SITE_OTHER): Payer: Medicare Other | Admitting: Cardiology

## 2021-08-31 VITALS — BP 100/58 | HR 66 | Ht 66.0 in | Wt 98.8 lb

## 2021-08-31 DIAGNOSIS — I110 Hypertensive heart disease with heart failure: Secondary | ICD-10-CM

## 2021-08-31 DIAGNOSIS — I209 Angina pectoris, unspecified: Secondary | ICD-10-CM

## 2021-08-31 DIAGNOSIS — R296 Repeated falls: Secondary | ICD-10-CM | POA: Diagnosis not present

## 2021-08-31 DIAGNOSIS — I5032 Chronic diastolic (congestive) heart failure: Secondary | ICD-10-CM | POA: Diagnosis not present

## 2021-08-31 DIAGNOSIS — Z7901 Long term (current) use of anticoagulants: Secondary | ICD-10-CM

## 2021-08-31 DIAGNOSIS — I482 Chronic atrial fibrillation, unspecified: Secondary | ICD-10-CM

## 2021-08-31 NOTE — Progress Notes (Signed)
Cardiology Office Note:    Date:  08/31/2021   ID:  Amber Cline, DOB 27-Apr-1926, MRN [id number]  PCP:  Eloisa Northern, MD  Cardiologist:  Norman Herrlich, MD    Referring MD: Eloisa Northern, MD    ASSESSMENT:    1. Chronic atrial fibrillation (HCC)   2. Long term current use of anticoagulant   3. Frequent falls   4. Angina pectoris (HCC)   5. Hypertensive heart disease with chronic diastolic congestive heart failure (HCC)    PLAN:    In order of problems listed above:  Patient is doing well with atrial fibrillation rate is controlled with combination beta-blocker and calcium blocker and tolerates reduced dose anticoagulant without bleeding and no falls Stable angina rare episodes continue her beta-blocker and calcium channel blocker nitroglycerin Controlled no edema continue diuretic ARB check blood pressure once weekly good technique and record    Next appointment: 9 months   Medication Adjustments/Labs and Tests Ordered: Current medicines are reviewed at length with the patient today.  Concerns regarding medicines are outlined above.  No orders of the defined types were placed in this encounter.  No orders of the defined types were placed in this encounter.   Chief Complaint  Patient presents with   Follow-up   Atrial Fibrillation    History of Present Illness:    Amber Cline is a 88 y.o. female with a hx of chronic atrial fibrillation with long-term anticoagulation angina with normal coronary arteriography frailty and frequent falls last seen 02/16/2021.   Compliance with diet, lifestyle and medications: Yes  Her son is present participates in evaluation decision making.  He supervises his mother including medications Overall she has done well she has had no bleeding with her anticoagulant and no further follow-ups. 1 episode of anginal discomfort but resolved with rest and has not needed nitroglycerin They do not check blood pressure at home I asked him to  start doing and recording No edema shortness of breath palpitation or syncope She is having labs drawn next week with her primary care physician Past Medical History:  Diagnosis Date   Angina pectoris (HCC) 06/06/2015   Overview:  With normal coronary arteriography 2006  Overview:  With normal coronary arteriography 2006   Chronic atrial fibrillation (HCC) 06/06/2015   Overview:  CHADS2 vasc score= 6  Overview:  CHADS2 vasc score= 6   Frequent falls 06/23/2019   HCD (hypertensive cardiovascular disease) 06/06/2015   Hypertensive heart disease 06/06/2015   Hypothyroidism (acquired) 12/06/2017   Long term current use of anticoagulant 06/06/2015    Past Surgical History:  Procedure Laterality Date   CHOLECYSTECTOMY     HAND SURGERY     LITHOTRIPSY      Current Medications: Current Meds  Medication Sig   apixaban (ELIQUIS) 2.5 MG TABS tablet Take 1 tablet (2.5 mg total) by mouth daily.   Apoaequorin (PREVAGEN PO) Take 1 tablet by mouth daily.    aspirin EC 81 MG tablet Take 81 mg by mouth daily. Swallow whole.   candesartan (ATACAND) 32 MG tablet Take 1 tablet (32 mg total) by mouth daily.   clindamycin (CLEOCIN) 150 MG capsule TAKE ALL 4 CAPSULES 1 HOUR BEFORE ANY DENTAL APPOINTMENT   Coenzyme Q10 (CO Q 10 PO) Take 100 mg by mouth daily.   diltiazem (TIADYLT ER) 240 MG 24 hr capsule Take 1 capsule (240 mg total) by mouth daily.   Glucosamine-Chondroit-Vit C-Mn (GLUCOSAMINE CHONDR 1500 COMPLX) CAPS Take 1 capsule by mouth daily.  levothyroxine (SYNTHROID) 112 MCG tablet Take 112 mcg by mouth daily.   metoprolol tartrate (LOPRESSOR) 25 MG tablet Take 0.5 tablets (12.5 mg total) by mouth 2 (two) times daily.   Multiple Vitamins-Minerals (CENTRUM SILVER PO) Take 1 tablet by mouth daily.   nitroGLYCERIN (NITROSTAT) 0.4 MG SL tablet Place 0.4 mg under the tongue every 5 (five) minutes x 3 doses as needed for chest pain.   Probiotic Product (ALIGN PO) Take 1 tablet by mouth daily as needed.    torsemide (DEMADEX) 20 MG tablet Take 10 mg by mouth daily.   Vitamin D, Ergocalciferol, (DRISDOL) 50000 units CAPS capsule Take 50,000 Units by mouth every 7 (seven) days.      Allergies:   Azithromycin, Peanut oil, Penicillins, and Cortisone   Social History   Socioeconomic History   Marital status: Widowed    Spouse name: Not on file   Number of children: Not on file   Years of education: Not on file   Highest education level: Not on file  Occupational History   Not on file  Tobacco Use   Smoking status: Never    Passive exposure: Never   Smokeless tobacco: Never  Vaping Use   Vaping Use: Never used  Substance and Sexual Activity   Alcohol use: No   Drug use: No   Sexual activity: Not on file  Other Topics Concern   Not on file  Social History Narrative   Not on file   Social Determinants of Health   Financial Resource Strain: Not on file  Food Insecurity: Not on file  Transportation Needs: Not on file  Physical Activity: Not on file  Stress: Not on file  Social Connections: Not on file     Family History: The patient's family history includes Deafness in her mother; Heart disease in her brother, father, and paternal grandfather. ROS:   Please see the history of present illness.    All other systems reviewed and are negative.  EKGs/Labs/Other Studies Reviewed:    The following studies were reviewed today:  EKG:  EKG ordered today and personally reviewed.  The ekg ordered today demonstrates rate controlled atrial fibrillation   Physical Exam:    VS:  BP (!) 100/58 (BP Location: Right Arm, Patient Position: Sitting)   Pulse 66   Ht 5\' 6"  (1.676 m)   Wt 98 lb 12.8 oz (44.8 kg)   SpO2 93%   BMI 15.95 kg/m     Wt Readings from Last 3 Encounters:  08/31/21 98 lb 12.8 oz (44.8 kg)  02/16/21 106 lb (48.1 kg)  08/20/20 106 lb 9.6 oz (48.4 kg)     GEN: She appears her age she looks increasingly frail well nourished, well developed in no acute  distress HEENT: Normal NECK: No JVD; No carotid bruits LYMPHATICS: No lymphadenopathy CARDIAC: Irregular rate and rhythm no murmurs, rubs, gallops RESPIRATORY:  Clear to auscultation without rales, wheezing or rhonchi  ABDOMEN: Soft, non-tender, non-distended MUSCULOSKELETAL:  No edema; No deformity  SKIN: Warm and dry NEUROLOGIC:  Alert and oriented x 3 PSYCHIATRIC:  Normal affect    Signed, 10/20/20, MD  08/31/2021 4:21 PM    [facility]

## 2021-08-31 NOTE — Patient Instructions (Signed)

## 2021-09-05 DIAGNOSIS — E039 Hypothyroidism, unspecified: Secondary | ICD-10-CM | POA: Diagnosis not present

## 2021-09-05 DIAGNOSIS — I480 Paroxysmal atrial fibrillation: Secondary | ICD-10-CM | POA: Diagnosis not present

## 2021-09-05 DIAGNOSIS — R627 Adult failure to thrive: Secondary | ICD-10-CM | POA: Diagnosis not present

## 2021-09-05 DIAGNOSIS — I10 Essential (primary) hypertension: Secondary | ICD-10-CM | POA: Diagnosis not present

## 2021-09-05 DIAGNOSIS — N1832 Chronic kidney disease, stage 3b: Secondary | ICD-10-CM | POA: Diagnosis not present

## 2021-09-12 ENCOUNTER — Ambulatory Visit (INDEPENDENT_AMBULATORY_CARE_PROVIDER_SITE_OTHER): Payer: Medicare Other | Admitting: Podiatry

## 2021-09-12 ENCOUNTER — Encounter: Payer: Self-pay | Admitting: Podiatry

## 2021-09-12 DIAGNOSIS — B351 Tinea unguium: Secondary | ICD-10-CM

## 2021-09-12 DIAGNOSIS — I739 Peripheral vascular disease, unspecified: Secondary | ICD-10-CM

## 2021-09-12 DIAGNOSIS — M79674 Pain in right toe(s): Secondary | ICD-10-CM

## 2021-09-12 DIAGNOSIS — N1832 Chronic kidney disease, stage 3b: Secondary | ICD-10-CM | POA: Diagnosis not present

## 2021-09-12 DIAGNOSIS — Z7901 Long term (current) use of anticoagulants: Secondary | ICD-10-CM

## 2021-09-12 DIAGNOSIS — M79675 Pain in left toe(s): Secondary | ICD-10-CM

## 2021-09-17 ENCOUNTER — Other Ambulatory Visit: Payer: Self-pay | Admitting: Cardiology

## 2021-09-22 DIAGNOSIS — H353132 Nonexudative age-related macular degeneration, bilateral, intermediate dry stage: Secondary | ICD-10-CM | POA: Diagnosis not present

## 2021-09-22 DIAGNOSIS — H40013 Open angle with borderline findings, low risk, bilateral: Secondary | ICD-10-CM | POA: Diagnosis not present

## 2021-09-22 DIAGNOSIS — H52203 Unspecified astigmatism, bilateral: Secondary | ICD-10-CM | POA: Diagnosis not present

## 2021-09-22 DIAGNOSIS — Z961 Presence of intraocular lens: Secondary | ICD-10-CM | POA: Diagnosis not present

## 2021-12-19 ENCOUNTER — Ambulatory Visit (INDEPENDENT_AMBULATORY_CARE_PROVIDER_SITE_OTHER): Payer: Medicare Other | Admitting: Podiatry

## 2021-12-19 ENCOUNTER — Ambulatory Visit: Payer: Medicare Other | Admitting: Podiatry

## 2021-12-19 DIAGNOSIS — M79674 Pain in right toe(s): Secondary | ICD-10-CM | POA: Diagnosis not present

## 2021-12-19 DIAGNOSIS — B351 Tinea unguium: Secondary | ICD-10-CM | POA: Diagnosis not present

## 2021-12-19 DIAGNOSIS — Z7901 Long term (current) use of anticoagulants: Secondary | ICD-10-CM

## 2021-12-19 DIAGNOSIS — M79675 Pain in left toe(s): Secondary | ICD-10-CM

## 2021-12-19 DIAGNOSIS — I739 Peripheral vascular disease, unspecified: Secondary | ICD-10-CM

## 2021-12-19 NOTE — Progress Notes (Signed)
  Subjective:  Patient ID: Amber Cline, female    DOB: 1926/09/05,  MRN: [id number]  Chief Complaint  Patient presents with   Nail Problem    routine nail care    88 y.o. female presents with the above complaint. History confirmed with patient.  Patient presents for routine nail care.  She has painful thickened elongated dystrophic nails x5 both feet.  She also has a hammertoe of the second toe on the right foot.  She is unable to trim the nails due to mobility issues and their thickness.  Coming in from [facility] nail care.  Objective:  Physical Exam: warm, good capillary refill, nail exam onychomycosis of the toenails, onycholysis, and dystrophic nails, no trophic changes or ulcerative lesions. DP pulses palpable, PT pulses palpable, and protective sensation intact Left Foot: Hammertoe deformity of the lesser digits Right Foot: Hammertoe deformity of the lesser digits  No images are attached to the encounter.  Assessment:   1. Pain due to onychomycosis of toenails of both feet   2. PVD (peripheral vascular disease) ([place])   3. Anticoagulant long-term use      Plan:  Patient was evaluated and treated and all questions answered.  Onychomycosis with pain  -Nails palliatively debrided as below. -Educated on self-care  Procedure: Nail Debridement Rationale: Pain Type of Debridement: manual, sharp debridement. Instrumentation: Nail nipper, rotary burr. Number of Nails: 10  Return in about 3 months (around 03/21/2022) for RFC.         Everitt Amber, DPM [facility]

## 2021-12-31 ENCOUNTER — Other Ambulatory Visit: Payer: Self-pay | Admitting: Cardiology

## 2022-01-02 NOTE — Telephone Encounter (Signed)
Refills for Atacand and Tiadylt sent to pharmacy

## 2022-01-09 DIAGNOSIS — E039 Hypothyroidism, unspecified: Secondary | ICD-10-CM | POA: Diagnosis not present

## 2022-01-09 DIAGNOSIS — N1832 Chronic kidney disease, stage 3b: Secondary | ICD-10-CM | POA: Diagnosis not present

## 2022-01-09 DIAGNOSIS — Z0000 Encounter for general adult medical examination without abnormal findings: Secondary | ICD-10-CM | POA: Diagnosis not present

## 2022-01-09 DIAGNOSIS — I480 Paroxysmal atrial fibrillation: Secondary | ICD-10-CM | POA: Diagnosis not present

## 2022-01-09 DIAGNOSIS — Z681 Body mass index (BMI) 19 or less, adult: Secondary | ICD-10-CM | POA: Diagnosis not present

## 2022-01-09 DIAGNOSIS — Z23 Encounter for immunization: Secondary | ICD-10-CM | POA: Diagnosis not present

## 2022-01-09 DIAGNOSIS — E782 Mixed hyperlipidemia: Secondary | ICD-10-CM | POA: Diagnosis not present

## 2022-01-09 DIAGNOSIS — I10 Essential (primary) hypertension: Secondary | ICD-10-CM | POA: Diagnosis not present

## 2022-02-08 ENCOUNTER — Other Ambulatory Visit: Payer: Self-pay

## 2022-02-14 ENCOUNTER — Other Ambulatory Visit: Payer: Self-pay

## 2022-02-14 MED ORDER — LEVOTHYROXINE SODIUM 112 MCG PO TABS: 112.0000 ug | ORAL_TABLET | Freq: Every day | ORAL | 2 refills | Status: DC

## 2022-02-17 ENCOUNTER — Other Ambulatory Visit: Payer: Self-pay

## 2022-03-10 ENCOUNTER — Ambulatory Visit: Payer: Medicare Other | Admitting: Internal Medicine

## 2022-03-12 DIAGNOSIS — F419 Anxiety disorder, unspecified: Secondary | ICD-10-CM | POA: Diagnosis not present

## 2022-03-12 DIAGNOSIS — E43 Unspecified severe protein-calorie malnutrition: Secondary | ICD-10-CM | POA: Diagnosis not present

## 2022-03-12 DIAGNOSIS — E870 Hyperosmolality and hypernatremia: Secondary | ICD-10-CM | POA: Diagnosis not present

## 2022-03-12 DIAGNOSIS — M7989 Other specified soft tissue disorders: Secondary | ICD-10-CM | POA: Diagnosis not present

## 2022-03-12 DIAGNOSIS — R2681 Unsteadiness on feet: Secondary | ICD-10-CM | POA: Diagnosis not present

## 2022-03-12 DIAGNOSIS — R41841 Cognitive communication deficit: Secondary | ICD-10-CM | POA: Diagnosis not present

## 2022-03-12 DIAGNOSIS — Z7982 Long term (current) use of aspirin: Secondary | ICD-10-CM | POA: Diagnosis not present

## 2022-03-12 DIAGNOSIS — I129 Hypertensive chronic kidney disease with stage 1 through stage 4 chronic kidney disease, or unspecified chronic kidney disease: Secondary | ICD-10-CM | POA: Diagnosis not present

## 2022-03-12 DIAGNOSIS — Z681 Body mass index (BMI) 19 or less, adult: Secondary | ICD-10-CM | POA: Diagnosis not present

## 2022-03-12 DIAGNOSIS — F0390 Unspecified dementia without behavioral disturbance: Secondary | ICD-10-CM | POA: Diagnosis not present

## 2022-03-12 DIAGNOSIS — Z7401 Bed confinement status: Secondary | ICD-10-CM | POA: Diagnosis not present

## 2022-03-12 DIAGNOSIS — I340 Nonrheumatic mitral (valve) insufficiency: Secondary | ICD-10-CM | POA: Diagnosis not present

## 2022-03-12 DIAGNOSIS — E860 Dehydration: Secondary | ICD-10-CM | POA: Diagnosis not present

## 2022-03-12 DIAGNOSIS — I10 Essential (primary) hypertension: Secondary | ICD-10-CM | POA: Diagnosis not present

## 2022-03-12 DIAGNOSIS — Z736 Limitation of activities due to disability: Secondary | ICD-10-CM | POA: Diagnosis not present

## 2022-03-12 DIAGNOSIS — S7290XA Unspecified fracture of unspecified femur, initial encounter for closed fracture: Secondary | ICD-10-CM | POA: Diagnosis not present

## 2022-03-12 DIAGNOSIS — E872 Acidosis, unspecified: Secondary | ICD-10-CM | POA: Diagnosis not present

## 2022-03-12 DIAGNOSIS — W19XXXA Unspecified fall, initial encounter: Secondary | ICD-10-CM | POA: Diagnosis not present

## 2022-03-12 DIAGNOSIS — A419 Sepsis, unspecified organism: Secondary | ICD-10-CM | POA: Diagnosis not present

## 2022-03-12 DIAGNOSIS — E876 Hypokalemia: Secondary | ICD-10-CM | POA: Diagnosis not present

## 2022-03-12 DIAGNOSIS — Z79899 Other long term (current) drug therapy: Secondary | ICD-10-CM | POA: Diagnosis not present

## 2022-03-12 DIAGNOSIS — N179 Acute kidney failure, unspecified: Secondary | ICD-10-CM | POA: Diagnosis not present

## 2022-03-12 DIAGNOSIS — R17 Unspecified jaundice: Secondary | ICD-10-CM | POA: Diagnosis not present

## 2022-03-12 DIAGNOSIS — I509 Heart failure, unspecified: Secondary | ICD-10-CM | POA: Diagnosis not present

## 2022-03-12 DIAGNOSIS — R609 Edema, unspecified: Secondary | ICD-10-CM | POA: Diagnosis not present

## 2022-03-12 DIAGNOSIS — D649 Anemia, unspecified: Secondary | ICD-10-CM | POA: Diagnosis not present

## 2022-03-12 DIAGNOSIS — S72491A Other fracture of lower end of right femur, initial encounter for closed fracture: Secondary | ICD-10-CM | POA: Diagnosis not present

## 2022-03-12 DIAGNOSIS — R633 Feeding difficulties, unspecified: Secondary | ICD-10-CM | POA: Diagnosis not present

## 2022-03-12 DIAGNOSIS — E861 Hypovolemia: Secondary | ICD-10-CM | POA: Diagnosis not present

## 2022-03-12 DIAGNOSIS — M6259 Muscle wasting and atrophy, not elsewhere classified, multiple sites: Secondary | ICD-10-CM | POA: Diagnosis not present

## 2022-03-12 DIAGNOSIS — S72351A Displaced comminuted fracture of shaft of right femur, initial encounter for closed fracture: Secondary | ICD-10-CM | POA: Diagnosis not present

## 2022-03-12 DIAGNOSIS — Z880 Allergy status to penicillin: Secondary | ICD-10-CM | POA: Diagnosis not present

## 2022-03-12 DIAGNOSIS — Z7901 Long term (current) use of anticoagulants: Secondary | ICD-10-CM | POA: Diagnosis not present

## 2022-03-12 DIAGNOSIS — J90 Pleural effusion, not elsewhere classified: Secondary | ICD-10-CM | POA: Diagnosis not present

## 2022-03-12 DIAGNOSIS — M25561 Pain in right knee: Secondary | ICD-10-CM | POA: Diagnosis not present

## 2022-03-12 DIAGNOSIS — I480 Paroxysmal atrial fibrillation: Secondary | ICD-10-CM | POA: Diagnosis not present

## 2022-03-12 DIAGNOSIS — Z7409 Other reduced mobility: Secondary | ICD-10-CM | POA: Diagnosis not present

## 2022-03-12 DIAGNOSIS — Z87442 Personal history of urinary calculi: Secondary | ICD-10-CM | POA: Diagnosis not present

## 2022-03-12 DIAGNOSIS — I4891 Unspecified atrial fibrillation: Secondary | ICD-10-CM | POA: Diagnosis not present

## 2022-03-12 DIAGNOSIS — S72451A Displaced supracondylar fracture without intracondylar extension of lower end of right femur, initial encounter for closed fracture: Secondary | ICD-10-CM | POA: Diagnosis not present

## 2022-03-12 DIAGNOSIS — R5381 Other malaise: Secondary | ICD-10-CM | POA: Diagnosis not present

## 2022-03-12 DIAGNOSIS — Z8719 Personal history of other diseases of the digestive system: Secondary | ICD-10-CM | POA: Diagnosis not present

## 2022-03-12 DIAGNOSIS — Z4789 Encounter for other orthopedic aftercare: Secondary | ICD-10-CM | POA: Diagnosis not present

## 2022-03-12 DIAGNOSIS — E039 Hypothyroidism, unspecified: Secondary | ICD-10-CM | POA: Diagnosis not present

## 2022-03-12 DIAGNOSIS — I21A1 Myocardial infarction type 2: Secondary | ICD-10-CM | POA: Diagnosis not present

## 2022-03-12 DIAGNOSIS — M1990 Unspecified osteoarthritis, unspecified site: Secondary | ICD-10-CM | POA: Diagnosis not present

## 2022-03-12 DIAGNOSIS — R0600 Dyspnea, unspecified: Secondary | ICD-10-CM | POA: Diagnosis not present

## 2022-03-12 DIAGNOSIS — E118 Type 2 diabetes mellitus with unspecified complications: Secondary | ICD-10-CM | POA: Diagnosis not present

## 2022-03-12 DIAGNOSIS — N189 Chronic kidney disease, unspecified: Secondary | ICD-10-CM | POA: Diagnosis not present

## 2022-03-12 DIAGNOSIS — S72491D Other fracture of lower end of right femur, subsequent encounter for closed fracture with routine healing: Secondary | ICD-10-CM | POA: Diagnosis not present

## 2022-03-12 DIAGNOSIS — Z8673 Personal history of transient ischemic attack (TIA), and cerebral infarction without residual deficits: Secondary | ICD-10-CM | POA: Diagnosis not present

## 2022-03-12 DIAGNOSIS — R1312 Dysphagia, oropharyngeal phase: Secondary | ICD-10-CM | POA: Diagnosis not present

## 2022-03-13 DIAGNOSIS — I340 Nonrheumatic mitral (valve) insufficiency: Secondary | ICD-10-CM | POA: Diagnosis not present

## 2022-03-17 DIAGNOSIS — S7290XA Unspecified fracture of unspecified femur, initial encounter for closed fracture: Secondary | ICD-10-CM | POA: Diagnosis not present

## 2022-03-17 DIAGNOSIS — R404 Transient alteration of awareness: Secondary | ICD-10-CM | POA: Diagnosis not present

## 2022-03-17 DIAGNOSIS — Z736 Limitation of activities due to disability: Secondary | ICD-10-CM | POA: Diagnosis not present

## 2022-03-17 DIAGNOSIS — R1310 Dysphagia, unspecified: Secondary | ICD-10-CM | POA: Diagnosis not present

## 2022-03-17 DIAGNOSIS — Z8673 Personal history of transient ischemic attack (TIA), and cerebral infarction without residual deficits: Secondary | ICD-10-CM | POA: Diagnosis not present

## 2022-03-17 DIAGNOSIS — S72451A Displaced supracondylar fracture without intracondylar extension of lower end of right femur, initial encounter for closed fracture: Secondary | ICD-10-CM | POA: Diagnosis not present

## 2022-03-17 DIAGNOSIS — F419 Anxiety disorder, unspecified: Secondary | ICD-10-CM | POA: Diagnosis not present

## 2022-03-17 DIAGNOSIS — I21A1 Myocardial infarction type 2: Secondary | ICD-10-CM | POA: Diagnosis not present

## 2022-03-17 DIAGNOSIS — J189 Pneumonia, unspecified organism: Secondary | ICD-10-CM | POA: Diagnosis not present

## 2022-03-17 DIAGNOSIS — J90 Pleural effusion, not elsewhere classified: Secondary | ICD-10-CM | POA: Diagnosis not present

## 2022-03-17 DIAGNOSIS — I4891 Unspecified atrial fibrillation: Secondary | ICD-10-CM | POA: Diagnosis not present

## 2022-03-17 DIAGNOSIS — I10 Essential (primary) hypertension: Secondary | ICD-10-CM | POA: Diagnosis not present

## 2022-03-17 DIAGNOSIS — N200 Calculus of kidney: Secondary | ICD-10-CM | POA: Diagnosis not present

## 2022-03-17 DIAGNOSIS — G9341 Metabolic encephalopathy: Secondary | ICD-10-CM | POA: Diagnosis not present

## 2022-03-17 DIAGNOSIS — R531 Weakness: Secondary | ICD-10-CM | POA: Diagnosis not present

## 2022-03-17 DIAGNOSIS — N179 Acute kidney failure, unspecified: Secondary | ICD-10-CM | POA: Diagnosis not present

## 2022-03-17 DIAGNOSIS — J9811 Atelectasis: Secondary | ICD-10-CM | POA: Diagnosis not present

## 2022-03-17 DIAGNOSIS — R41841 Cognitive communication deficit: Secondary | ICD-10-CM | POA: Diagnosis not present

## 2022-03-17 DIAGNOSIS — R633 Feeding difficulties, unspecified: Secondary | ICD-10-CM | POA: Diagnosis not present

## 2022-03-17 DIAGNOSIS — E870 Hyperosmolality and hypernatremia: Secondary | ICD-10-CM | POA: Diagnosis not present

## 2022-03-17 DIAGNOSIS — J159 Unspecified bacterial pneumonia: Secondary | ICD-10-CM | POA: Diagnosis not present

## 2022-03-17 DIAGNOSIS — Z880 Allergy status to penicillin: Secondary | ICD-10-CM | POA: Diagnosis not present

## 2022-03-17 DIAGNOSIS — N3289 Other specified disorders of bladder: Secondary | ICD-10-CM | POA: Diagnosis not present

## 2022-03-17 DIAGNOSIS — E878 Other disorders of electrolyte and fluid balance, not elsewhere classified: Secondary | ICD-10-CM | POA: Diagnosis not present

## 2022-03-17 DIAGNOSIS — E861 Hypovolemia: Secondary | ICD-10-CM | POA: Diagnosis not present

## 2022-03-17 DIAGNOSIS — D649 Anemia, unspecified: Secondary | ICD-10-CM | POA: Diagnosis not present

## 2022-03-17 DIAGNOSIS — R188 Other ascites: Secondary | ICD-10-CM | POA: Diagnosis not present

## 2022-03-17 DIAGNOSIS — J8410 Pulmonary fibrosis, unspecified: Secondary | ICD-10-CM | POA: Diagnosis not present

## 2022-03-17 DIAGNOSIS — M1990 Unspecified osteoarthritis, unspecified site: Secondary | ICD-10-CM | POA: Diagnosis not present

## 2022-03-17 DIAGNOSIS — R6521 Severe sepsis with septic shock: Secondary | ICD-10-CM | POA: Diagnosis not present

## 2022-03-17 DIAGNOSIS — R4182 Altered mental status, unspecified: Secondary | ICD-10-CM | POA: Diagnosis not present

## 2022-03-17 DIAGNOSIS — E860 Dehydration: Secondary | ICD-10-CM | POA: Diagnosis not present

## 2022-03-17 DIAGNOSIS — R402421 Glasgow coma scale score 9-12, in the field [EMT or ambulance]: Secondary | ICD-10-CM | POA: Diagnosis not present

## 2022-03-17 DIAGNOSIS — M6259 Muscle wasting and atrophy, not elsewhere classified, multiple sites: Secondary | ICD-10-CM | POA: Diagnosis not present

## 2022-03-17 DIAGNOSIS — F0390 Unspecified dementia without behavioral disturbance: Secondary | ICD-10-CM | POA: Diagnosis not present

## 2022-03-17 DIAGNOSIS — Z7401 Bed confinement status: Secondary | ICD-10-CM | POA: Diagnosis not present

## 2022-03-17 DIAGNOSIS — Z7409 Other reduced mobility: Secondary | ICD-10-CM | POA: Diagnosis not present

## 2022-03-17 DIAGNOSIS — Z87442 Personal history of urinary calculi: Secondary | ICD-10-CM | POA: Diagnosis not present

## 2022-03-17 DIAGNOSIS — K5900 Constipation, unspecified: Secondary | ICD-10-CM | POA: Diagnosis not present

## 2022-03-17 DIAGNOSIS — E43 Unspecified severe protein-calorie malnutrition: Secondary | ICD-10-CM | POA: Diagnosis not present

## 2022-03-17 DIAGNOSIS — D72829 Elevated white blood cell count, unspecified: Secondary | ICD-10-CM | POA: Diagnosis not present

## 2022-03-17 DIAGNOSIS — A419 Sepsis, unspecified organism: Secondary | ICD-10-CM | POA: Diagnosis not present

## 2022-03-17 DIAGNOSIS — R1312 Dysphagia, oropharyngeal phase: Secondary | ICD-10-CM | POA: Diagnosis not present

## 2022-03-17 DIAGNOSIS — Z4789 Encounter for other orthopedic aftercare: Secondary | ICD-10-CM | POA: Diagnosis not present

## 2022-03-17 DIAGNOSIS — I959 Hypotension, unspecified: Secondary | ICD-10-CM | POA: Diagnosis not present

## 2022-03-17 DIAGNOSIS — R0602 Shortness of breath: Secondary | ICD-10-CM | POA: Diagnosis not present

## 2022-03-17 DIAGNOSIS — E118 Type 2 diabetes mellitus with unspecified complications: Secondary | ICD-10-CM | POA: Diagnosis not present

## 2022-03-17 DIAGNOSIS — E039 Hypothyroidism, unspecified: Secondary | ICD-10-CM | POA: Diagnosis not present

## 2022-03-17 DIAGNOSIS — R2681 Unsteadiness on feet: Secondary | ICD-10-CM | POA: Diagnosis not present

## 2022-03-17 DIAGNOSIS — Z515 Encounter for palliative care: Secondary | ICD-10-CM | POA: Diagnosis not present

## 2022-03-17 DIAGNOSIS — Z7901 Long term (current) use of anticoagulants: Secondary | ICD-10-CM | POA: Diagnosis not present

## 2022-03-17 DIAGNOSIS — K449 Diaphragmatic hernia without obstruction or gangrene: Secondary | ICD-10-CM | POA: Diagnosis not present

## 2022-03-17 DIAGNOSIS — S72491D Other fracture of lower end of right femur, subsequent encounter for closed fracture with routine healing: Secondary | ICD-10-CM | POA: Diagnosis not present

## 2022-03-21 DIAGNOSIS — E039 Hypothyroidism, unspecified: Secondary | ICD-10-CM | POA: Diagnosis not present

## 2022-03-21 DIAGNOSIS — N179 Acute kidney failure, unspecified: Secondary | ICD-10-CM | POA: Diagnosis not present

## 2022-03-21 DIAGNOSIS — I4891 Unspecified atrial fibrillation: Secondary | ICD-10-CM | POA: Diagnosis not present

## 2022-03-21 DIAGNOSIS — Z4789 Encounter for other orthopedic aftercare: Secondary | ICD-10-CM | POA: Diagnosis not present

## 2022-03-23 DIAGNOSIS — J159 Unspecified bacterial pneumonia: Secondary | ICD-10-CM | POA: Diagnosis not present

## 2022-03-23 DIAGNOSIS — Z4789 Encounter for other orthopedic aftercare: Secondary | ICD-10-CM | POA: Diagnosis not present

## 2022-03-24 ENCOUNTER — Ambulatory Visit: Payer: Medicare Other | Admitting: Podiatry

## 2022-03-27 ENCOUNTER — Ambulatory Visit: Payer: Medicare Other | Admitting: Podiatry

## 2022-03-28 DIAGNOSIS — E860 Dehydration: Secondary | ICD-10-CM | POA: Diagnosis not present

## 2022-03-28 DIAGNOSIS — J159 Unspecified bacterial pneumonia: Secondary | ICD-10-CM | POA: Diagnosis not present

## 2022-03-28 DIAGNOSIS — E039 Hypothyroidism, unspecified: Secondary | ICD-10-CM | POA: Diagnosis not present

## 2022-03-29 ENCOUNTER — Ambulatory Visit: Payer: Medicare Other | Admitting: Internal Medicine

## 2022-04-01 DIAGNOSIS — J159 Unspecified bacterial pneumonia: Secondary | ICD-10-CM | POA: Diagnosis not present

## 2022-04-05 DIAGNOSIS — E039 Hypothyroidism, unspecified: Secondary | ICD-10-CM | POA: Diagnosis not present

## 2022-04-05 DIAGNOSIS — E861 Hypovolemia: Secondary | ICD-10-CM | POA: Diagnosis not present

## 2022-04-05 DIAGNOSIS — K5900 Constipation, unspecified: Secondary | ICD-10-CM | POA: Diagnosis not present

## 2022-04-08 DIAGNOSIS — D649 Anemia, unspecified: Secondary | ICD-10-CM | POA: Diagnosis not present

## 2022-04-08 DIAGNOSIS — D72829 Elevated white blood cell count, unspecified: Secondary | ICD-10-CM | POA: Diagnosis not present

## 2022-04-08 DIAGNOSIS — R531 Weakness: Secondary | ICD-10-CM | POA: Diagnosis not present

## 2022-04-08 DIAGNOSIS — K449 Diaphragmatic hernia without obstruction or gangrene: Secondary | ICD-10-CM | POA: Diagnosis not present

## 2022-04-08 DIAGNOSIS — J8410 Pulmonary fibrosis, unspecified: Secondary | ICD-10-CM | POA: Diagnosis not present

## 2022-04-08 DIAGNOSIS — A419 Sepsis, unspecified organism: Secondary | ICD-10-CM | POA: Diagnosis not present

## 2022-04-08 DIAGNOSIS — E878 Other disorders of electrolyte and fluid balance, not elsewhere classified: Secondary | ICD-10-CM | POA: Diagnosis not present

## 2022-04-08 DIAGNOSIS — J90 Pleural effusion, not elsewhere classified: Secondary | ICD-10-CM | POA: Diagnosis not present

## 2022-04-08 DIAGNOSIS — E870 Hyperosmolality and hypernatremia: Secondary | ICD-10-CM | POA: Diagnosis not present

## 2022-04-08 DIAGNOSIS — R188 Other ascites: Secondary | ICD-10-CM | POA: Diagnosis not present

## 2022-04-08 DIAGNOSIS — F419 Anxiety disorder, unspecified: Secondary | ICD-10-CM | POA: Diagnosis not present

## 2022-04-08 DIAGNOSIS — R6521 Severe sepsis with septic shock: Secondary | ICD-10-CM | POA: Diagnosis not present

## 2022-04-08 DIAGNOSIS — Z87442 Personal history of urinary calculi: Secondary | ICD-10-CM | POA: Diagnosis not present

## 2022-04-08 DIAGNOSIS — Z515 Encounter for palliative care: Secondary | ICD-10-CM | POA: Diagnosis not present

## 2022-04-08 DIAGNOSIS — J189 Pneumonia, unspecified organism: Secondary | ICD-10-CM | POA: Diagnosis not present

## 2022-04-08 DIAGNOSIS — R402421 Glasgow coma scale score 9-12, in the field [EMT or ambulance]: Secondary | ICD-10-CM | POA: Diagnosis not present

## 2022-04-08 DIAGNOSIS — Z8673 Personal history of transient ischemic attack (TIA), and cerebral infarction without residual deficits: Secondary | ICD-10-CM | POA: Diagnosis not present

## 2022-04-08 DIAGNOSIS — N200 Calculus of kidney: Secondary | ICD-10-CM | POA: Diagnosis not present

## 2022-04-08 DIAGNOSIS — Z7901 Long term (current) use of anticoagulants: Secondary | ICD-10-CM | POA: Diagnosis not present

## 2022-04-08 DIAGNOSIS — R404 Transient alteration of awareness: Secondary | ICD-10-CM | POA: Diagnosis not present

## 2022-04-08 DIAGNOSIS — K5900 Constipation, unspecified: Secondary | ICD-10-CM | POA: Diagnosis not present

## 2022-04-08 DIAGNOSIS — S72001D Fracture of unspecified part of neck of right femur, subsequent encounter for closed fracture with routine healing: Secondary | ICD-10-CM | POA: Diagnosis not present

## 2022-04-08 DIAGNOSIS — E039 Hypothyroidism, unspecified: Secondary | ICD-10-CM | POA: Diagnosis not present

## 2022-04-08 DIAGNOSIS — F0390 Unspecified dementia without behavioral disturbance: Secondary | ICD-10-CM | POA: Diagnosis not present

## 2022-04-08 DIAGNOSIS — I959 Hypotension, unspecified: Secondary | ICD-10-CM | POA: Diagnosis not present

## 2022-04-08 DIAGNOSIS — I10 Essential (primary) hypertension: Secondary | ICD-10-CM | POA: Diagnosis not present

## 2022-04-08 DIAGNOSIS — R1310 Dysphagia, unspecified: Secondary | ICD-10-CM | POA: Diagnosis not present

## 2022-04-08 DIAGNOSIS — E0590 Thyrotoxicosis, unspecified without thyrotoxic crisis or storm: Secondary | ICD-10-CM | POA: Diagnosis not present

## 2022-04-08 DIAGNOSIS — N3289 Other specified disorders of bladder: Secondary | ICD-10-CM | POA: Diagnosis not present

## 2022-04-08 DIAGNOSIS — Z8719 Personal history of other diseases of the digestive system: Secondary | ICD-10-CM | POA: Diagnosis not present

## 2022-04-08 DIAGNOSIS — J9811 Atelectasis: Secondary | ICD-10-CM | POA: Diagnosis not present

## 2022-04-08 DIAGNOSIS — I4891 Unspecified atrial fibrillation: Secondary | ICD-10-CM | POA: Diagnosis not present

## 2022-04-08 DIAGNOSIS — G9341 Metabolic encephalopathy: Secondary | ICD-10-CM | POA: Diagnosis not present

## 2022-04-08 DIAGNOSIS — K921 Melena: Secondary | ICD-10-CM | POA: Diagnosis not present

## 2022-04-08 DIAGNOSIS — M1990 Unspecified osteoarthritis, unspecified site: Secondary | ICD-10-CM | POA: Diagnosis not present

## 2022-04-08 DIAGNOSIS — E860 Dehydration: Secondary | ICD-10-CM | POA: Diagnosis not present

## 2022-04-08 DIAGNOSIS — N179 Acute kidney failure, unspecified: Secondary | ICD-10-CM | POA: Diagnosis not present

## 2022-04-08 DIAGNOSIS — Z880 Allergy status to penicillin: Secondary | ICD-10-CM | POA: Diagnosis not present

## 2022-04-08 DIAGNOSIS — R4182 Altered mental status, unspecified: Secondary | ICD-10-CM | POA: Diagnosis not present

## 2022-04-10 ENCOUNTER — Ambulatory Visit: Payer: Medicare Other | Admitting: Internal Medicine

## 2022-04-18 ENCOUNTER — Ambulatory Visit: Payer: Medicare Other | Admitting: Podiatry

## 2022-04-20 DEATH — deceased
